# Patient Record
Sex: Female | Born: 1960 | Race: White | Hispanic: No | Smoking: Former smoker
Health system: Southern US, Community
[De-identification: ages and names within clinical notes are randomized; demographics above are authoritative.]

## PROBLEM LIST (undated history)

## (undated) DIAGNOSIS — Z9289 Personal history of other medical treatment: Secondary | ICD-10-CM

## (undated) DIAGNOSIS — D219 Benign neoplasm of connective and other soft tissue, unspecified: Secondary | ICD-10-CM

## (undated) HISTORY — DX: Benign neoplasm of connective and other soft tissue, unspecified: D21.9

## (undated) HISTORY — DX: Personal history of other medical treatment: Z92.89

---

## 2004-11-20 ENCOUNTER — Ambulatory Visit: Payer: Self-pay | Admitting: Unknown Physician Specialty

## 2005-12-11 ENCOUNTER — Ambulatory Visit: Payer: Self-pay | Admitting: Unknown Physician Specialty

## 2006-12-19 ENCOUNTER — Ambulatory Visit: Payer: Self-pay | Admitting: Unknown Physician Specialty

## 2007-12-23 ENCOUNTER — Ambulatory Visit: Payer: Self-pay | Admitting: Unknown Physician Specialty

## 2008-12-27 ENCOUNTER — Ambulatory Visit: Payer: Self-pay | Admitting: Unknown Physician Specialty

## 2010-02-15 ENCOUNTER — Ambulatory Visit: Payer: Self-pay | Admitting: Unknown Physician Specialty

## 2011-02-20 ENCOUNTER — Ambulatory Visit: Payer: Self-pay | Admitting: Unknown Physician Specialty

## 2014-01-05 DIAGNOSIS — Z9289 Personal history of other medical treatment: Secondary | ICD-10-CM

## 2014-01-05 HISTORY — DX: Personal history of other medical treatment: Z92.89

## 2014-01-05 LAB — HM PAP SMEAR

## 2016-01-11 DIAGNOSIS — Z1239 Encounter for other screening for malignant neoplasm of breast: Secondary | ICD-10-CM | POA: Diagnosis not present

## 2016-01-11 DIAGNOSIS — R030 Elevated blood-pressure reading, without diagnosis of hypertension: Secondary | ICD-10-CM | POA: Diagnosis not present

## 2016-01-11 DIAGNOSIS — Z1211 Encounter for screening for malignant neoplasm of colon: Secondary | ICD-10-CM | POA: Diagnosis not present

## 2016-01-11 DIAGNOSIS — Z01419 Encounter for gynecological examination (general) (routine) without abnormal findings: Secondary | ICD-10-CM | POA: Diagnosis not present

## 2016-01-12 ENCOUNTER — Other Ambulatory Visit: Payer: Self-pay | Admitting: Obstetrics and Gynecology

## 2016-01-12 DIAGNOSIS — Z1231 Encounter for screening mammogram for malignant neoplasm of breast: Secondary | ICD-10-CM

## 2016-02-13 DIAGNOSIS — Z1211 Encounter for screening for malignant neoplasm of colon: Secondary | ICD-10-CM | POA: Diagnosis not present

## 2016-03-11 ENCOUNTER — Ambulatory Visit
Admission: RE | Admit: 2016-03-11 | Discharge: 2016-03-11 | Disposition: A | Discharge status: Home / Self Care | Payer: BLUE CROSS/BLUE SHIELD | Source: Ambulatory Visit | Attending: Obstetrics and Gynecology | Admitting: Obstetrics and Gynecology

## 2016-03-11 ENCOUNTER — Other Ambulatory Visit: Payer: Self-pay | Admitting: Obstetrics and Gynecology

## 2016-03-11 DIAGNOSIS — Z1231 Encounter for screening mammogram for malignant neoplasm of breast: Secondary | ICD-10-CM

## 2016-03-11 LAB — HM MAMMOGRAPHY

## 2016-04-14 HISTORY — PX: COLONOSCOPY: SHX174

## 2016-05-03 ENCOUNTER — Encounter
Admission: RE | Disposition: A | Discharge status: Home / Self Care | Payer: Self-pay | Source: Ambulatory Visit | Attending: Gastroenterology

## 2016-05-03 ENCOUNTER — Ambulatory Visit
Admission: RE | Admit: 2016-05-03 | Discharge: 2016-05-03 | Disposition: A | Discharge status: Home / Self Care | Payer: BLUE CROSS/BLUE SHIELD | Source: Ambulatory Visit | Attending: Gastroenterology | Admitting: Gastroenterology

## 2016-05-03 ENCOUNTER — Ambulatory Visit: Payer: BLUE CROSS/BLUE SHIELD | Admitting: Anesthesiology

## 2016-05-03 ENCOUNTER — Encounter: Payer: Self-pay | Admitting: *Deleted

## 2016-05-03 DIAGNOSIS — Z79899 Other long term (current) drug therapy: Secondary | ICD-10-CM | POA: Insufficient documentation

## 2016-05-03 DIAGNOSIS — Z87891 Personal history of nicotine dependence: Secondary | ICD-10-CM | POA: Insufficient documentation

## 2016-05-03 DIAGNOSIS — K635 Polyp of colon: Secondary | ICD-10-CM | POA: Diagnosis not present

## 2016-05-03 DIAGNOSIS — Q438 Other specified congenital malformations of intestine: Secondary | ICD-10-CM | POA: Insufficient documentation

## 2016-05-03 DIAGNOSIS — Z1211 Encounter for screening for malignant neoplasm of colon: Secondary | ICD-10-CM | POA: Diagnosis not present

## 2016-05-03 DIAGNOSIS — D120 Benign neoplasm of cecum: Secondary | ICD-10-CM | POA: Diagnosis not present

## 2016-05-03 HISTORY — PX: COLONOSCOPY: SHX5424

## 2016-05-03 SURGERY — COLONOSCOPY
Anesthesia: General

## 2016-05-03 MED ORDER — EPHEDRINE SULFATE 50 MG/ML IJ SOLN
INTRAMUSCULAR | Status: DC | PRN
  Administered 2016-05-03: 10 mg via INTRAVENOUS

## 2016-05-03 MED ORDER — MIDAZOLAM HCL 2 MG/2ML IJ SOLN
INTRAMUSCULAR | Status: DC | PRN
  Administered 2016-05-03: 2 mg via INTRAVENOUS

## 2016-05-03 MED ORDER — ONDANSETRON HCL 4 MG/2ML IJ SOLN: 4.0000 mg | Freq: Once | INTRAMUSCULAR | Status: DC | PRN

## 2016-05-03 MED ORDER — PROPOFOL 10 MG/ML IV BOLUS
INTRAVENOUS | Status: DC | PRN
Start: 2016-05-03 — End: 2016-05-03
  Administered 2016-05-03: 30 mg via INTRAVENOUS
  Administered 2016-05-03 (×2): 20 mg via INTRAVENOUS
  Administered 2016-05-03: 50 mg via INTRAVENOUS

## 2016-05-03 MED ORDER — SODIUM CHLORIDE 0.9 % IV SOLN: INTRAVENOUS | Status: DC

## 2016-05-03 MED ORDER — PROPOFOL 500 MG/50ML IV EMUL
INTRAVENOUS | Status: DC | PRN
  Administered 2016-05-03: 140 ug/kg/min via INTRAVENOUS

## 2016-05-03 MED ORDER — SODIUM CHLORIDE 0.9 % IV SOLN
INTRAVENOUS | Status: DC
  Administered 2016-05-03 (×2): via INTRAVENOUS

## 2016-05-03 MED ORDER — LIDOCAINE HCL (CARDIAC) 20 MG/ML IV SOLN
INTRAVENOUS | Status: DC | PRN
  Administered 2016-05-03: 40 mg via INTRAVENOUS

## 2016-05-03 MED ORDER — FENTANYL CITRATE (PF) 100 MCG/2ML IJ SOLN
25.0000 ug | INTRAMUSCULAR | Status: DC | PRN
  Administered 2016-05-03: 50 ug via INTRAVENOUS

## 2016-05-03 NOTE — Anesthesia Preprocedure Evaluation (Addendum)
Anesthesia Evaluation  Patient identified by MRN, date of birth, ID band Patient awake    Reviewed: Allergy & Precautions, NPO status , Patient's Chart, lab work & pertinent test results  Airway Mallampati: II  TM Distance: <3 FB     Dental no notable dental hx.    Pulmonary former smoker,    Pulmonary exam normal        Cardiovascular negative cardio ROS Normal cardiovascular exam     Neuro/Psych negative neurological ROS  negative psych ROS   GI/Hepatic Neg liver ROS,   Endo/Other  negative endocrine ROS  Renal/GU negative Renal ROS  negative genitourinary   Musculoskeletal negative musculoskeletal ROS (+)   Abdominal Normal abdominal exam  (+)   Peds negative pediatric ROS (+)  Hematology negative hematology ROS (+)   Anesthesia Other Findings   Reproductive/Obstetrics                            Anesthesia Physical Anesthesia Plan  ASA: II  Anesthesia Plan: General   Post-op Pain Management:    Induction: Intravenous  Airway Management Planned: Nasal Cannula  Additional Equipment:   Intra-op Plan:   Post-operative Plan:   Informed Consent: I have reviewed the patients History and Physical, chart, labs and discussed the procedure including the risks, benefits and alternatives for the proposed anesthesia with the patient or authorized representative who has indicated his/her understanding and acceptance.   Dental advisory given  Plan Discussed with: CRNA and Surgeon  Anesthesia Plan Comments:         Anesthesia Quick Evaluation

## 2016-05-03 NOTE — Transfer of Care (Signed)
Immediate Anesthesia Transfer of Care Note  Patient: Renee Bruce  Procedure(s) Performed: Procedure(s): COLONOSCOPY (N/A)  Patient Location: PACU  Anesthesia Type:General  Level of Consciousness: awake  Airway & Oxygen Therapy: Patient Spontanous Breathing and Patient connected to nasal cannula oxygen  Post-op Assessment: Report given to RN and Post -op Vital signs reviewed and stable  Post vital signs: Reviewed and stable  Last Vitals:  Vitals:   05/03/16 1415  BP: (!) 142/76  Pulse: 76  Resp: 20  Temp: 36.8 C    Last Pain:  Vitals:   05/03/16 1415  TempSrc: Tympanic         Complications: No apparent anesthesia complications

## 2016-05-03 NOTE — H&P (Signed)
Outpatient short stay form Pre-procedure 05/03/2016 2:46 PM Lollie Sails MD  Primary Physician: Fraser Din PA  Reason for visit:  Screening colonoscopy  History of present illness:  Patient is a 55 year old female presenting today as above. She takes no blood thinning agents or aspirin products. She tolerated her prep well. This is her first colonoscopy.    Current Facility-Administered Medications:  .  0.9 %  sodium chloride infusion, , Intravenous, Continuous, Lollie Sails, MD, Last Rate: 20 mL/hr at 05/03/16 1427 .  0.9 %  sodium chloride infusion, , Intravenous, Continuous, Lollie Sails, MD .  fentaNYL (SUBLIMAZE) injection 25 mcg, 25 mcg, Intravenous, Q5 min PRN, Alvin Critchley, MD .  ondansetron [facility]) injection 4 mg, 4 mg, Intravenous, Once PRN, Alvin Critchley, MD  Prescriptions Prior to Admission  Medication Sig Dispense Refill Last Dose  . calcium carbonate (OS-CAL - DOSED IN MG OF ELEMENTAL CALCIUM) 1250 (500 Ca) MG tablet Take 1 tablet by mouth.   Past Week at Unknown time  . magnesium 30 MG tablet Take 30 mg by mouth 2 (two) times daily.   Past Week at Unknown time  . Multiple Vitamin (MULTIVITAMIN) tablet Take 1 tablet by mouth daily.   Past Week at Unknown time  . Omega-3 Fatty Acids (FISH OIL) 1000 MG CAPS Take by mouth.   Past Week at Unknown time     No Known Allergies   History reviewed. No pertinent past medical history.  Review of systems:      Physical Exam    Heart and lungs: Regular rate and rhythm without rub or gallop, lungs are bilaterally clear.    HEENT: Normocephalic atraumatic eyes are anicteric    Other:     Pertinant exam for procedure: Soft nontender nondistended bowel sounds positive normoactive    Planned proceedures: Colonoscopy and indicated procedures. I have discussed the risks benefits and complications of procedures to include not limited to bleeding, infection, perforation and the risk of sedation and the  patient wishes to proceed.    Lollie Sails, MD Gastroenterology 05/03/2016  2:46 PM

## 2016-05-03 NOTE — Op Note (Signed)
[facility] Gastroenterology Patient Name: Renee Bruce Procedure Date: 05/03/2016 2:56 PM MRN: [id number] Account #: [id number] Date of Birth: 09-21-1960 Admit Type: Outpatient Age: 56 Room: [facility] ENDO ROOM 3 Gender: Female Note Status: Finalized Procedure:            Colonoscopy Indications:          Screening for colorectal malignant neoplasm, This is                        the patient's first colonoscopy Providers:            Lollie Sails, MD Referring MD:         No Local Md, MD (Referring MD) Medicines:            Monitored Anesthesia Care Complications:        No immediate complications. Procedure:            Pre-Anesthesia Assessment:                       - ASA Grade Assessment: II - A patient with mild                        systemic disease.                       After obtaining informed consent, the colonoscope was                        passed under direct vision. Throughout the procedure,                        the patient's blood pressure, pulse, and oxygen                        saturations were monitored continuously. The                        Colonoscope was introduced through the anus and                        advanced to the the cecum, identified by appendiceal                        orifice and ileocecal valve. The colonoscopy was                        unusually difficult due to a tortuous colon. Successful                        completion of the procedure was aided by changing the                        patient to a supine position, changing the patient to a                        prone position and using manual pressure. The patient                        tolerated the procedure well. The quality of the bowel  preparation was good. Findings:      A 5 mm polyp was found in the cecum. The polyp was sessile, behind the       proximal lip of the IC valve. Difficult to obtain position. The polyp       was  removed with a piecemeal technique using a cold biopsy forceps.       Resection and retrieval were complete.      The sigmoid colon, descending colon and transverse colon were       significantly redundant.      The digital rectal exam was normal. Impression:           - One 5 mm polyp in the cecum, removed piecemeal using                        a cold biopsy forceps. Resected and retrieved.                       - Redundant colon. Recommendation:       - Discharge patient to home.                       - Full liquid diet for 2 days, then advance as                        tolerated to soft diet for 3 days.                       - Await pathology results.                       - If adenomatous, repeat in one year. Procedure Code(s):    --- Professional ---                       [phone], Colonoscopy, flexible; with biopsy, single or                        multiple Diagnosis Code(s):    --- Professional ---                       Z12.11, Encounter for screening for malignant neoplasm                        of colon                       D12.0, Benign neoplasm of cecum                       Q43.8, Other specified congenital malformations of                        intestine CPT copyright 2016 American Medical Association. All rights reserved. The codes documented in this report are preliminary and upon coder review may  be revised to meet current compliance requirements. Lollie Sails, MD 05/03/2016 4:07:13 PM This report has been signed electronically. Number of Addenda: 0 Note Initiated On: 05/03/2016 2:56 PM Scope Withdrawal Time: 0 hours 31 minutes 33 seconds  Total Procedure Duration: 0 hours 49 minutes 1 second       [facility]

## 2016-05-04 ENCOUNTER — Encounter: Payer: Self-pay | Admitting: Gastroenterology

## 2016-05-04 NOTE — Anesthesia Postprocedure Evaluation (Signed)
Anesthesia Post Note  Patient: Renee Bruce  Procedure(s) Performed: Procedure(s) (LRB): COLONOSCOPY (N/A)  Patient location during evaluation: Endoscopy Anesthesia Type: General Level of consciousness: awake and alert Pain management: pain level controlled Vital Signs Assessment: post-procedure vital signs reviewed and stable Respiratory status: spontaneous breathing, nonlabored ventilation and respiratory function stable Cardiovascular status: blood pressure returned to baseline and stable Postop Assessment: no signs of nausea or vomiting Anesthetic complications: no    Last Vitals:  Vitals:   05/03/16 1620 05/03/16 1638  BP: 119/62 119/73  Pulse: 72 69  Resp: 12 14  Temp:      Last Pain:  Vitals:   05/03/16 1608  TempSrc: Tympanic                 Martha Clan

## 2016-05-07 LAB — SURGICAL PATHOLOGY

## 2016-08-12 DIAGNOSIS — L309 Dermatitis, unspecified: Secondary | ICD-10-CM | POA: Diagnosis not present

## 2016-08-12 DIAGNOSIS — L97921 Non-pressure chronic ulcer of unspecified part of left lower leg limited to breakdown of skin: Secondary | ICD-10-CM | POA: Diagnosis not present

## 2016-08-28 DIAGNOSIS — I872 Venous insufficiency (chronic) (peripheral): Secondary | ICD-10-CM | POA: Diagnosis not present

## 2016-08-28 DIAGNOSIS — L97921 Non-pressure chronic ulcer of unspecified part of left lower leg limited to breakdown of skin: Secondary | ICD-10-CM | POA: Diagnosis not present

## 2016-09-11 DIAGNOSIS — L97921 Non-pressure chronic ulcer of unspecified part of left lower leg limited to breakdown of skin: Secondary | ICD-10-CM | POA: Diagnosis not present

## 2016-09-24 DIAGNOSIS — L97921 Non-pressure chronic ulcer of unspecified part of left lower leg limited to breakdown of skin: Secondary | ICD-10-CM | POA: Diagnosis not present

## 2017-01-20 ENCOUNTER — Ambulatory Visit (INDEPENDENT_AMBULATORY_CARE_PROVIDER_SITE_OTHER): Payer: BLUE CROSS/BLUE SHIELD | Admitting: Obstetrics and Gynecology

## 2017-01-20 ENCOUNTER — Encounter: Payer: Self-pay | Admitting: Obstetrics and Gynecology

## 2017-01-20 VITALS — BP 116/62 | HR 72 | Ht 67.0 in | Wt 185.0 lb

## 2017-01-20 DIAGNOSIS — Z124 Encounter for screening for malignant neoplasm of cervix: Secondary | ICD-10-CM | POA: Diagnosis not present

## 2017-01-20 DIAGNOSIS — Z1231 Encounter for screening mammogram for malignant neoplasm of breast: Secondary | ICD-10-CM

## 2017-01-20 DIAGNOSIS — Z01419 Encounter for gynecological examination (general) (routine) without abnormal findings: Secondary | ICD-10-CM

## 2017-01-20 DIAGNOSIS — Z1239 Encounter for other screening for malignant neoplasm of breast: Secondary | ICD-10-CM

## 2017-01-20 DIAGNOSIS — Z1151 Encounter for screening for human papillomavirus (HPV): Secondary | ICD-10-CM | POA: Diagnosis not present

## 2017-01-20 NOTE — Progress Notes (Signed)
Chief Complaint  Patient presents with  . Gynecologic Exam    HPI:      [name] is a 56 y.o. G1P1001 who LMP was No LMP recorded. Patient is postmenopausal., presents today for her annual examination.  Her menses are absent due to menopause. She does not have intermenstrual bleeding.  She does have vasomotor sx infrequently.  Sex activity: not sexually active. She does not have vaginal dryness.  Last Pap: January 05, 2014  Results were: no abnormalities /neg HPV DNA.  Hx of STDs: none  Last mammogram: March 11, 2016  Results were: normal--routine follow-up in 12 months There is a FH of breast cancer in her PGM, genetic testing not indicated. There is no FH of ovarian cancer. The patient does not do self-breast exams.  Colonoscopy: colonoscopy 1 years ago without abnormalities.   Tobacco use: The patient denies current or previous tobacco use. Alcohol use: none Exercise: not active  She does get adequate calcium and Vitamin D in her diet.   Past Medical History:  Diagnosis Date  . Fibroid   . History of mammogram 03/11/2015; 03/11/16   birad I; neg  . History of Papanicolaou smear of cervix 01/05/2014   -/-    Past Surgical History:  Procedure Laterality Date  . COLONOSCOPY N/A 05/03/2016   Procedure: COLONOSCOPY;  Surgeon: Lollie Sails, MD;  Location: [facility] ENDOSCOPY;  Service: Endoscopy;  Laterality: N/A;  . COLONOSCOPY  04/2016   non-cancerous polyp; Dr. Vira Agar; repeat in 5-27yrs    Family History  Problem Relation Age of Onset  . Breast cancer Paternal Grandmother 25  . Hypertension Brother   . Cancer Maternal Grandfather 64    Social History   Social History  . Marital status: Divorced    Spouse name: N/A  . Number of children: N/A  . Years of education: N/A   Occupational History  . Not on file.   Social History Main Topics  . Smoking status: Former Research scientist (life sciences)  . Smokeless tobacco: Never Used  . Alcohol use Yes     Comment: rare    . Drug use: No  . Sexual activity: Not Currently    Birth control/ protection: None   Other Topics Concern  . Not on file   Social History Narrative  . No narrative on file     Current Outpatient Prescriptions:  .  calcium carbonate (OS-CAL - DOSED IN MG OF ELEMENTAL CALCIUM) 1250 (500 Ca) MG tablet, Take 1 tablet by mouth., Disp: , Rfl:  .  magnesium 30 MG tablet, Take 30 mg by mouth 2 (two) times daily., Disp: , Rfl:  .  Multiple Vitamin (MULTIVITAMIN) tablet, Take 1 tablet by mouth daily., Disp: , Rfl:  .  Omega-3 Fatty Acids (FISH OIL) 1000 MG CAPS, Take by mouth., Disp: , Rfl:  .  vitamin B-12 (CYANOCOBALAMIN) 100 MCG tablet, Take 100 mcg by mouth daily., Disp: , Rfl:    ROS:  Review of Systems  Constitutional: Negative for fatigue, fever and unexpected weight change.  Respiratory: Negative for cough, shortness of breath and wheezing.   Cardiovascular: Negative for chest pain, palpitations and leg swelling.  Gastrointestinal: Negative for blood in stool, constipation, diarrhea, nausea and vomiting.  Endocrine: Negative for cold intolerance, heat intolerance and polyuria.  Genitourinary: Negative for dyspareunia, dysuria, flank pain, frequency, genital sores, hematuria, menstrual problem, pelvic pain, urgency, vaginal bleeding, vaginal discharge and vaginal pain.  Musculoskeletal: Negative for back pain, joint swelling and myalgias.  Skin: Negative for rash.  Neurological: Negative for dizziness, syncope, light-headedness, numbness and headaches.  Hematological: Negative for adenopathy.  Psychiatric/Behavioral: Negative for agitation, confusion, sleep disturbance and suicidal ideas. The patient is not nervous/anxious.      Objective: BP 116/62 (BP Location: Left Arm, Patient Position: Sitting, Cuff Size: Normal)   Pulse 72   Ht 5\' 7"  (1.702 m)   Wt 185 lb (83.9 kg)   BMI 28.98 kg/m    Physical Exam  Constitutional: She is oriented to person, place, and time. She  appears well-developed and well-nourished.  Genitourinary: Vagina normal and uterus normal. There is no rash, tenderness, lesion or Bartholin's cyst on the right labia. There is no rash, tenderness, lesion or Bartholin's cyst on the left labia. No erythema, tenderness or bleeding in the vagina. No vaginal discharge found. Right adnexum does not display mass and does not display tenderness. Left adnexum does not display mass and does not display tenderness. Cervix does not exhibit motion tenderness, discharge, friability or polyp. Uterus is not enlarged or tender.  Neck: Normal range of motion. No thyromegaly present.  Cardiovascular: Normal rate, regular rhythm and normal heart sounds.   No murmur heard. Pulmonary/Chest: Effort normal and breath sounds normal. Right breast exhibits no mass, no nipple discharge, no skin change and no tenderness. Left breast exhibits no mass, no nipple discharge, no skin change and no tenderness.  Abdominal: Soft. There is no tenderness. There is no guarding.  Musculoskeletal: Normal range of motion.  Neurological: She is alert and oriented to person, place, and time. No cranial nerve deficit.  Psychiatric: She has a normal mood and affect. Her behavior is normal.  Nursing note and vitals reviewed.    Assessment/Plan:  Encounter for annual routine gynecological examination  Cervical cancer screening - Plan: IGP, Aptima HPV  Screening for HPV (human papillomavirus) - Plan: IGP, Aptima HPV  Screening for breast cancer - Pt to sched mammo at [facility]. - Plan: MM DIGITAL SCREENING BILATERAL          GYN counsel mammography screening, menopause, adequate intake of calcium and vitamin D     F/U  Return in about 1 year (around 01/20/2018).  Mohamad Bruso B. Rukia Mcgillivray, PA-C 01/20/2017 9:07 AM

## 2017-01-22 LAB — IGP, APTIMA HPV
HPV APTIMA: NEGATIVE
PAP SMEAR COMMENT: 0

## 2017-03-13 ENCOUNTER — Ambulatory Visit
Admission: RE | Admit: 2017-03-13 | Discharge: 2017-03-13 | Disposition: A | Discharge status: Home / Self Care | Payer: BLUE CROSS/BLUE SHIELD | Source: Ambulatory Visit | Attending: Obstetrics and Gynecology | Admitting: Obstetrics and Gynecology

## 2017-03-13 DIAGNOSIS — Z1231 Encounter for screening mammogram for malignant neoplasm of breast: Secondary | ICD-10-CM | POA: Diagnosis not present

## 2017-03-13 DIAGNOSIS — Z1239 Encounter for other screening for malignant neoplasm of breast: Secondary | ICD-10-CM

## 2017-03-14 ENCOUNTER — Encounter: Payer: Self-pay | Admitting: Obstetrics and Gynecology

## 2018-01-21 ENCOUNTER — Ambulatory Visit (INDEPENDENT_AMBULATORY_CARE_PROVIDER_SITE_OTHER): Payer: BLUE CROSS/BLUE SHIELD | Admitting: Obstetrics and Gynecology

## 2018-01-21 ENCOUNTER — Encounter: Payer: Self-pay | Admitting: Obstetrics and Gynecology

## 2018-01-21 VITALS — BP 118/72 | HR 89 | Ht 67.0 in | Wt 186.5 lb

## 2018-01-21 DIAGNOSIS — Z01419 Encounter for gynecological examination (general) (routine) without abnormal findings: Secondary | ICD-10-CM

## 2018-01-21 DIAGNOSIS — Z13220 Encounter for screening for lipoid disorders: Secondary | ICD-10-CM | POA: Diagnosis not present

## 2018-01-21 DIAGNOSIS — Z131 Encounter for screening for diabetes mellitus: Secondary | ICD-10-CM | POA: Diagnosis not present

## 2018-01-21 DIAGNOSIS — Z0000 Encounter for general adult medical examination without abnormal findings: Secondary | ICD-10-CM | POA: Diagnosis not present

## 2018-01-21 DIAGNOSIS — Z1239 Encounter for other screening for malignant neoplasm of breast: Secondary | ICD-10-CM

## 2018-01-21 DIAGNOSIS — Z1231 Encounter for screening mammogram for malignant neoplasm of breast: Secondary | ICD-10-CM | POA: Diagnosis not present

## 2018-01-21 NOTE — Progress Notes (Signed)
Chief Complaint  Patient presents with  . Gynecologic Exam     HPI:      [name] is a 57 y.o. G1P1001 who LMP was No LMP recorded. Patient is postmenopausal., presents today for her annual examination.  Her menses are absent due to menopause. She does not have intermenstrual bleeding.  She does have vasomotor sx occasionally that are tolerable.  Sex activity: not sexually active. She does not have vaginal dryness.  Last Pap: 01/20/17 Results were: no abnormalities /neg HPV DNA.  Hx of STDs: none  Last mammogram: March 13, 2017  Results were: normal--routine follow-up in 12 months There is a FH of breast cancer in her PGM, genetic testing not indicated. There is no FH of ovarian cancer. The patient does not do self-breast exams.  Colonoscopy: colonoscopy 2 years ago without abnormalities.   Tobacco use: The patient denies current or previous tobacco use. Alcohol use: none Exercise: not active  She does get adequate calcium but not Vitamin D in her diet.  No recent labs.   Past Medical History:  Diagnosis Date  . Fibroid   . History of mammogram 03/11/2015; 03/11/16   birad I; neg  . History of Papanicolaou smear of cervix 01/05/2014   -/-    Past Surgical History:  Procedure Laterality Date  . COLONOSCOPY N/A 05/03/2016   Procedure: COLONOSCOPY;  Surgeon: Lollie Sails, MD;  Location: [facility] ENDOSCOPY;  Service: Endoscopy;  Laterality: N/A;  . COLONOSCOPY  04/2016   non-cancerous polyp; Dr. Vira Agar; repeat in 5-25yrs    Family History  Problem Relation Age of Onset  . Breast cancer Paternal Grandmother 107  . Hypertension Brother   . Cancer Maternal Grandfather 41    Social History   Socioeconomic History  . Marital status: Divorced    Spouse name: Not on file  . Number of children: Not on file  . Years of education: Not on file  . Highest education level: Not on file  Occupational History  . Not on file  Social Needs  .  Financial resource strain: Not on file  . Food insecurity:    Worry: Not on file    Inability: Not on file  . Transportation needs:    Medical: Not on file    Non-medical: Not on file  Tobacco Use  . Smoking status: Former Research scientist (life sciences)  . Smokeless tobacco: Never Used  Substance and Sexual Activity  . Alcohol use: Yes    Comment: rare  . Drug use: No  . Sexual activity: Not Currently    Birth control/protection: None, Post-menopausal  Lifestyle  . Physical activity:    Days per week: Not on file    Minutes per session: Not on file  . Stress: Not on file  Relationships  . Social connections:    Talks on phone: Not on file    Gets together: Not on file    Attends religious service: Not on file    Active member of club or organization: Not on file    Attends meetings of clubs or organizations: Not on file    Relationship status: Not on file  . Intimate partner violence:    Fear of current or ex partner: Not on file    Emotionally abused: Not on file    Physically abused: Not on file    Forced sexual activity: Not on file  Other Topics Concern  . Not on file  Social History Narrative  . Not on  file    Current Outpatient Medications on File Prior to Visit  Medication Sig Dispense Refill  . magnesium 30 MG tablet Take 30 mg by mouth 2 (two) times daily.     No current facility-administered medications on file prior to visit.     ROS:  Review of Systems  Constitutional: Negative for fatigue, fever and unexpected weight change.  Respiratory: Negative for cough, shortness of breath and wheezing.   Cardiovascular: Negative for chest pain, palpitations and leg swelling.  Gastrointestinal: Negative for blood in stool, constipation, diarrhea, nausea and vomiting.  Endocrine: Negative for cold intolerance, heat intolerance and polyuria.  Genitourinary: Negative for dyspareunia, dysuria, flank pain, frequency, genital sores, hematuria, menstrual problem, pelvic pain, urgency,  vaginal bleeding, vaginal discharge and vaginal pain.  Musculoskeletal: Negative for back pain, joint swelling and myalgias.  Skin: Negative for rash.  Neurological: Negative for dizziness, syncope, light-headedness, numbness and headaches.  Hematological: Negative for adenopathy.  Psychiatric/Behavioral: Negative for agitation, confusion, sleep disturbance and suicidal ideas. The patient is not nervous/anxious.      Objective: BP 118/72   Pulse 89   Ht 5\' 7"  (1.702 m)   Wt 186 lb 8 oz (84.6 kg)   BMI 29.21 kg/m    Physical Exam  Constitutional: She is oriented to person, place, and time. She appears well-developed and well-nourished.  Genitourinary: Vagina normal and uterus normal. There is no rash or tenderness on the right labia. There is no rash or tenderness on the left labia. No erythema or tenderness in the vagina. No vaginal discharge found. Right adnexum does not display mass and does not display tenderness. Left adnexum does not display mass and does not display tenderness. Cervix does not exhibit motion tenderness or polyp. Uterus is not enlarged or tender.  Neck: Normal range of motion. No thyromegaly present.  Cardiovascular: Normal rate, regular rhythm and normal heart sounds.  No murmur heard. Pulmonary/Chest: Effort normal and breath sounds normal. Right breast exhibits no mass, no nipple discharge, no skin change and no tenderness. Left breast exhibits no mass, no nipple discharge, no skin change and no tenderness.  Abdominal: Soft. There is no tenderness. There is no guarding.  Musculoskeletal: Normal range of motion.  Neurological: She is alert and oriented to person, place, and time. No cranial nerve deficit.  Psychiatric: She has a normal mood and affect. Her behavior is normal.  Vitals reviewed.   Assessment/Plan: Encounter for annual routine gynecological examination  Screening for breast cancer - Pt to sched mammo. - Plan: MM 3D SCREEN BREAST  BILATERAL  Blood tests for routine general physical examination - Plan: Comprehensive metabolic panel, Lipid panel, Hemoglobin A1c  Screening cholesterol level - Plan: Lipid panel  Screening for diabetes mellitus - Plan: Hemoglobin A1c          GYN counsel mammography screening, menopause, adequate intake of calcium and vitamin D, diet and exercise     F/U  Return in about 1 year (around 01/22/2019).  Amrom Ore B. Tyshauna Finkbiner, PA-C 01/21/2018 8:35 AM

## 2018-01-21 NOTE — Patient Instructions (Signed)
I value your feedback and entrusting us with your care. If you get a [facility] patient survey, I would appreciate you taking the time to let us know about your experience today. Thank you! 

## 2018-03-18 ENCOUNTER — Encounter: Payer: Self-pay | Admitting: Obstetrics and Gynecology

## 2018-03-18 ENCOUNTER — Ambulatory Visit
Admission: RE | Admit: 2018-03-18 | Discharge: 2018-03-18 | Disposition: A | Discharge status: Home / Self Care | Payer: BLUE CROSS/BLUE SHIELD | Source: Ambulatory Visit | Attending: Obstetrics and Gynecology | Admitting: Obstetrics and Gynecology

## 2018-03-18 DIAGNOSIS — Z1239 Encounter for other screening for malignant neoplasm of breast: Secondary | ICD-10-CM

## 2018-03-18 DIAGNOSIS — Z1231 Encounter for screening mammogram for malignant neoplasm of breast: Secondary | ICD-10-CM | POA: Diagnosis not present

## 2019-02-17 ENCOUNTER — Ambulatory Visit: Payer: BLUE CROSS/BLUE SHIELD | Admitting: Obstetrics and Gynecology

## 2019-02-24 NOTE — Patient Instructions (Signed)
I value your feedback and entrusting us with your care. If you get a [facility] patient survey, I would appreciate you taking the time to let us know about your experience today. Thank you! 

## 2019-02-24 NOTE — Progress Notes (Signed)
Chief Complaint  Patient presents with  . Gynecologic Exam     HPI:      [name] is a 58 y.o. G1P1001 who LMP was No LMP recorded. Patient is postmenopausal., presents today for her annual examination.  Her menses are absent due to menopause. She does not have intermenstrual bleeding. She does have vasomotor sx occasionally that are tolerable.  Sex activity: not sexually active. She does not have vaginal dryness.  Last Pap: 01/20/17 Results were: no abnormalities /neg HPV DNA.  Hx of STDs: none  Last mammogram: 03/18/18  Results were: normal--routine follow-up in 12 months There is a FH of breast cancer in her PGM, genetic testing not indicated. There is no FH of ovarian cancer. The patient does not do self-breast exams.  Colonoscopy: colonoscopy 3 years ago without abnormalities.   Tobacco use: The patient denies current or previous tobacco use. Alcohol use: none Exercise: not active  She does get adequate calcium but not Vitamin D in her diet.  Didn't do fasting labs last yr.   Past Medical History:  Diagnosis Date  . Fibroid   . History of mammogram 03/11/2015; 03/11/16   birad I; neg  . History of Papanicolaou smear of cervix 01/05/2014   -/-    Past Surgical History:  Procedure Laterality Date  . COLONOSCOPY N/A 05/03/2016   Procedure: COLONOSCOPY;  Surgeon: Lollie Sails, MD;  Location: [facility] ENDOSCOPY;  Service: Endoscopy;  Laterality: N/A;  . COLONOSCOPY  04/2016   non-cancerous polyp; Dr. Vira Agar; repeat in 5-37yrs    Family History  Problem Relation Age of Onset  . Breast cancer Paternal Grandmother 23  . Hypertension Brother   . Cancer Maternal Grandfather 80    Social History   Socioeconomic History  . Marital status: Divorced    Spouse name: Not on file  . Number of children: Not on file  . Years of education: Not on file  . Highest education level: Not on file  Occupational History  . Not on file  Social Needs  .  Financial resource strain: Not on file  . Food insecurity    Worry: Not on file    Inability: Not on file  . Transportation needs    Medical: Not on file    Non-medical: Not on file  Tobacco Use  . Smoking status: Former Research scientist (life sciences)  . Smokeless tobacco: Never Used  Substance and Sexual Activity  . Alcohol use: Yes    Comment: rare  . Drug use: No  . Sexual activity: Not Currently    Birth control/protection: None, Post-menopausal  Lifestyle  . Physical activity    Days per week: Not on file    Minutes per session: Not on file  . Stress: Not on file  Relationships  . Social Herbalist on phone: Not on file    Gets together: Not on file    Attends religious service: Not on file    Active member of club or organization: Not on file    Attends meetings of clubs or organizations: Not on file    Relationship status: Not on file  . Intimate partner violence    Fear of current or ex partner: Not on file    Emotionally abused: Not on file    Physically abused: Not on file    Forced sexual activity: Not on file  Other Topics Concern  . Not on file  Social History Narrative  . Not on  file    No current outpatient medications on file prior to visit.   No current facility-administered medications on file prior to visit.     ROS:  Review of Systems  Constitutional: Negative for fatigue, fever and unexpected weight change.  Respiratory: Negative for cough, shortness of breath and wheezing.   Cardiovascular: Negative for chest pain, palpitations and leg swelling.  Gastrointestinal: Negative for blood in stool, constipation, diarrhea, nausea and vomiting.  Endocrine: Negative for cold intolerance, heat intolerance and polyuria.  Genitourinary: Negative for dyspareunia, dysuria, flank pain, frequency, genital sores, hematuria, menstrual problem, pelvic pain, urgency, vaginal bleeding, vaginal discharge and vaginal pain.  Musculoskeletal: Positive for arthralgias. Negative  for back pain, joint swelling and myalgias.  Skin: Negative for rash.  Neurological: Negative for dizziness, syncope, light-headedness, numbness and headaches.  Hematological: Negative for adenopathy.  Psychiatric/Behavioral: Negative for agitation, confusion, sleep disturbance and suicidal ideas. The patient is not nervous/anxious.      Objective: BP 122/80   Ht 5\' 7"  (1.702 m)   Wt 199 lb 6.4 oz (90.4 kg)   BMI 31.23 kg/m    Physical Exam Constitutional:      Appearance: She is well-developed.  Genitourinary:     Vulva, vagina, uterus, right adnexa and left adnexa normal.     No vulval lesion or tenderness noted.     No vaginal discharge, erythema or tenderness.     No cervical motion tenderness or polyp.     Uterus is not enlarged or tender.     No right or left adnexal mass present.     Right adnexa not tender.     Left adnexa not tender.  Neck:     Musculoskeletal: Normal range of motion.     Thyroid: No thyromegaly.  Cardiovascular:     Rate and Rhythm: Normal rate and regular rhythm.     Heart sounds: Normal heart sounds. No murmur.  Pulmonary:     Effort: Pulmonary effort is normal.     Breath sounds: Normal breath sounds.  Chest:     Breasts:        Right: No mass, nipple discharge, skin change or tenderness.        Left: No mass, nipple discharge, skin change or tenderness.  Abdominal:     Palpations: Abdomen is soft.     Tenderness: There is no abdominal tenderness. There is no guarding.  Musculoskeletal: Normal range of motion.  Neurological:     General: No focal deficit present.     Mental Status: She is alert and oriented to person, place, and time.     Cranial Nerves: No cranial nerve deficit.  Skin:    General: Skin is warm and dry.  Psychiatric:        Mood and Affect: Mood normal.        Behavior: Behavior normal.        Thought Content: Thought content normal.        Judgment: Judgment normal.  Vitals signs reviewed.     Assessment/Plan:  Encounter for annual routine gynecological examination -   Screening for breast cancer - Plan: MM 3D SCREEN BREAST BILATERAL, Pt to sched mammo  Blood tests for routine general physical examination - Plan: Comprehensive metabolic panel, Lipid panel, Hemoglobin A1c,   Screening cholesterol level - Plan: Lipid panel,   Screening for diabetes mellitus - Plan: Hemoglobin A1c,   BMI 31.0-31.9,adult - Plan: Comprehensive metabolic panel, Lipid panel, Hemoglobin A1c,  GYN counsel mammography screening, menopause, adequate intake of calcium and vitamin D, diet and exercise     F/U  Return in 1 year (on 02/25/2020).  Ej Pinson B. Jaleen Grupp, PA-C 02/25/2019 8:33 AM

## 2019-02-25 ENCOUNTER — Ambulatory Visit (INDEPENDENT_AMBULATORY_CARE_PROVIDER_SITE_OTHER): Payer: BC Managed Care – PPO | Admitting: Obstetrics and Gynecology

## 2019-02-25 ENCOUNTER — Encounter: Payer: Self-pay | Admitting: Obstetrics and Gynecology

## 2019-02-25 ENCOUNTER — Other Ambulatory Visit: Payer: Self-pay

## 2019-02-25 VITALS — BP 122/80 | Ht 67.0 in | Wt 199.4 lb

## 2019-02-25 DIAGNOSIS — Z01419 Encounter for gynecological examination (general) (routine) without abnormal findings: Secondary | ICD-10-CM

## 2019-02-25 DIAGNOSIS — Z131 Encounter for screening for diabetes mellitus: Secondary | ICD-10-CM

## 2019-02-25 DIAGNOSIS — Z1239 Encounter for other screening for malignant neoplasm of breast: Secondary | ICD-10-CM

## 2019-02-25 DIAGNOSIS — Z6831 Body mass index (BMI) 31.0-31.9, adult: Secondary | ICD-10-CM

## 2019-02-25 DIAGNOSIS — Z0000 Encounter for general adult medical examination without abnormal findings: Secondary | ICD-10-CM

## 2019-02-25 DIAGNOSIS — Z13220 Encounter for screening for lipoid disorders: Secondary | ICD-10-CM

## 2019-04-05 ENCOUNTER — Encounter: Payer: Self-pay | Admitting: Obstetrics and Gynecology

## 2019-04-05 ENCOUNTER — Ambulatory Visit
Admission: RE | Admit: 2019-04-05 | Discharge: 2019-04-05 | Disposition: A | Discharge status: Home / Self Care | Payer: BC Managed Care – PPO | Source: Ambulatory Visit | Attending: Obstetrics and Gynecology | Admitting: Obstetrics and Gynecology

## 2019-04-05 DIAGNOSIS — Z1231 Encounter for screening mammogram for malignant neoplasm of breast: Secondary | ICD-10-CM | POA: Diagnosis not present

## 2019-04-05 DIAGNOSIS — Z1239 Encounter for other screening for malignant neoplasm of breast: Secondary | ICD-10-CM

## 2019-07-12 IMAGING — MG MM DIGITAL SCREENING BILAT W/ TOMO W/ CAD
9 of 14 series · 9 of 30 positions shown · non-contrast
Comparison: Previous exam(s).

CLINICAL DATA: Screening.

EXAM:
2D DIGITAL SCREENING BILATERAL MAMMOGRAM WITH CAD AND ADJUNCT TOMO

[L MLO (1 of 2)]
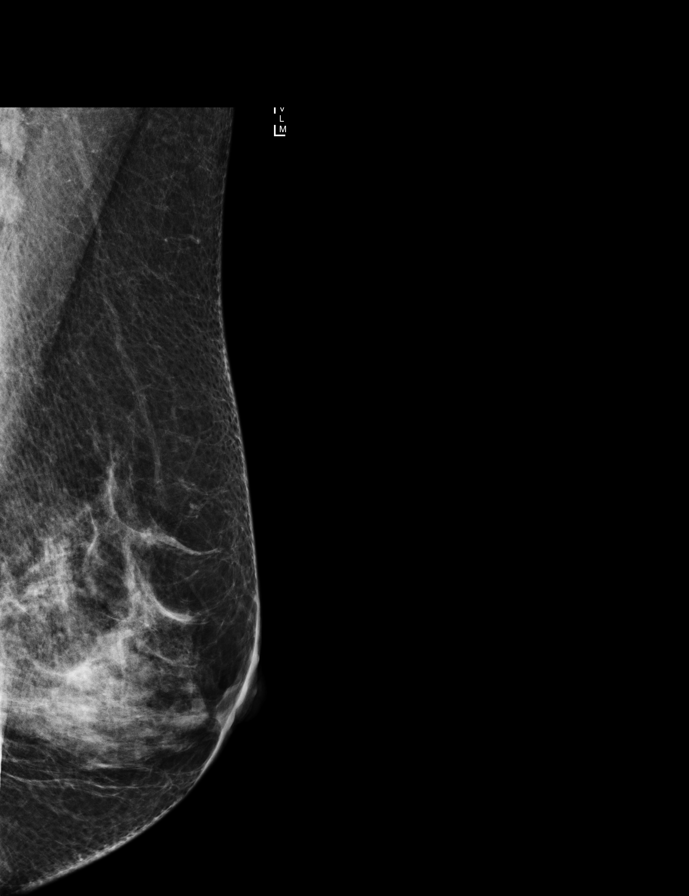

[R XCCL]
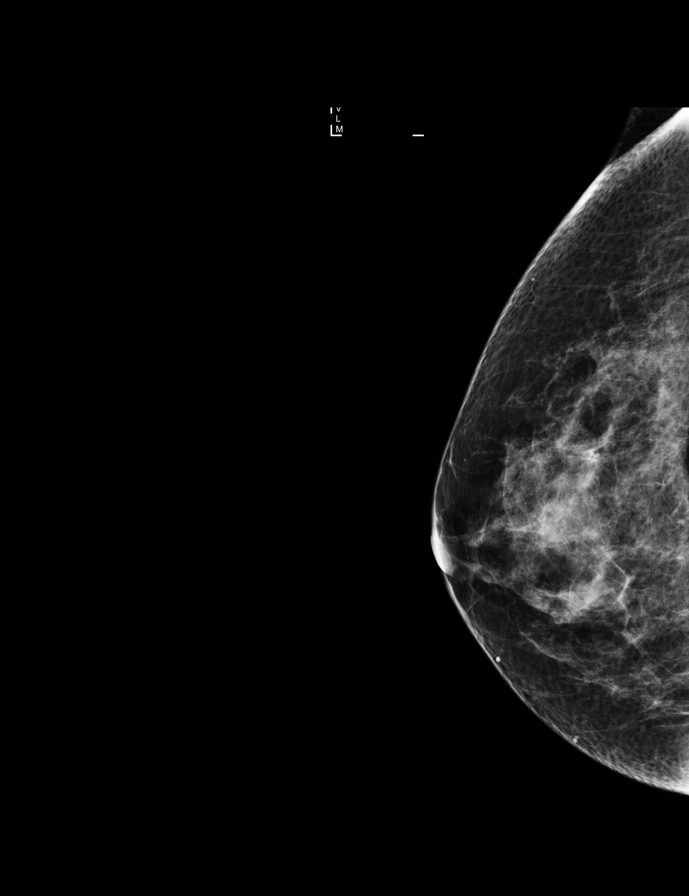

[L CC]
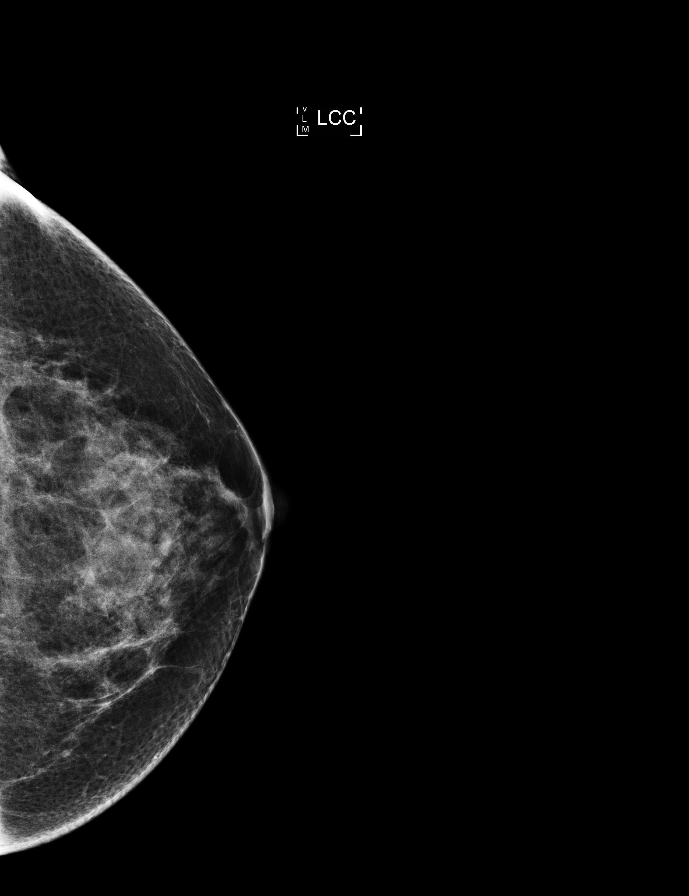

[L MLO (2 of 2)]
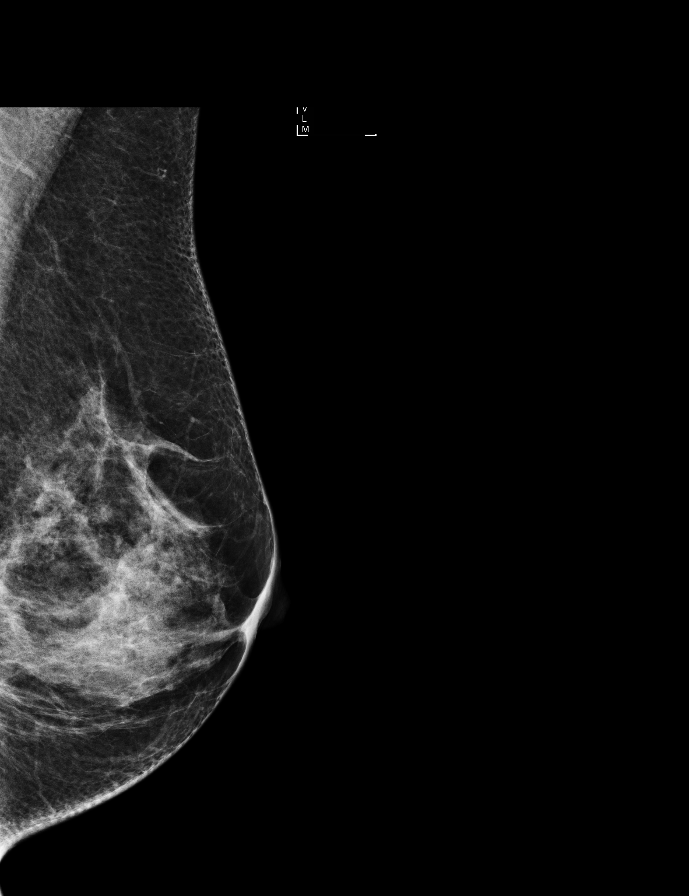

[L MLO synth-2D]
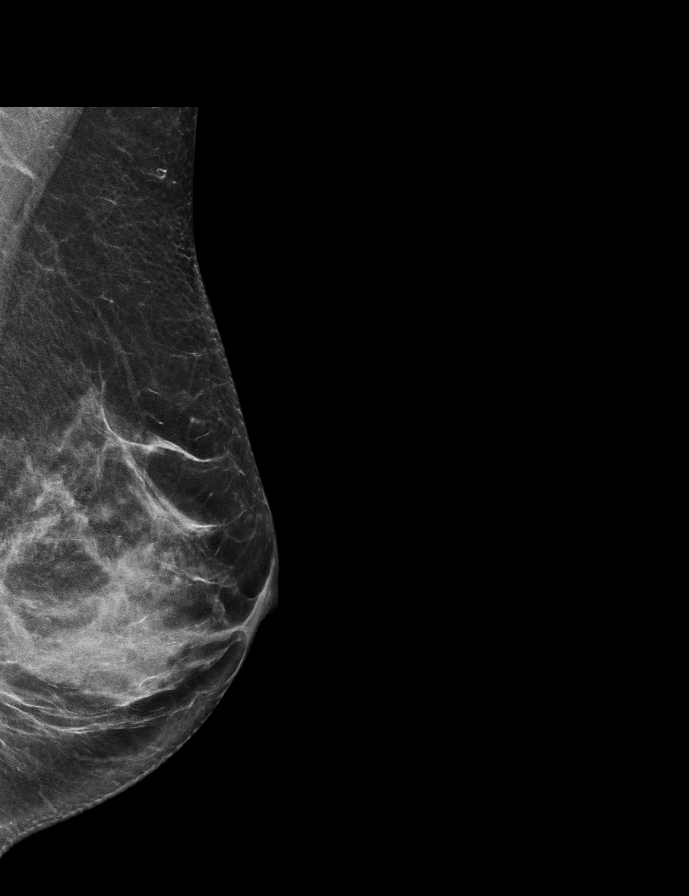

[L CC synth-2D]
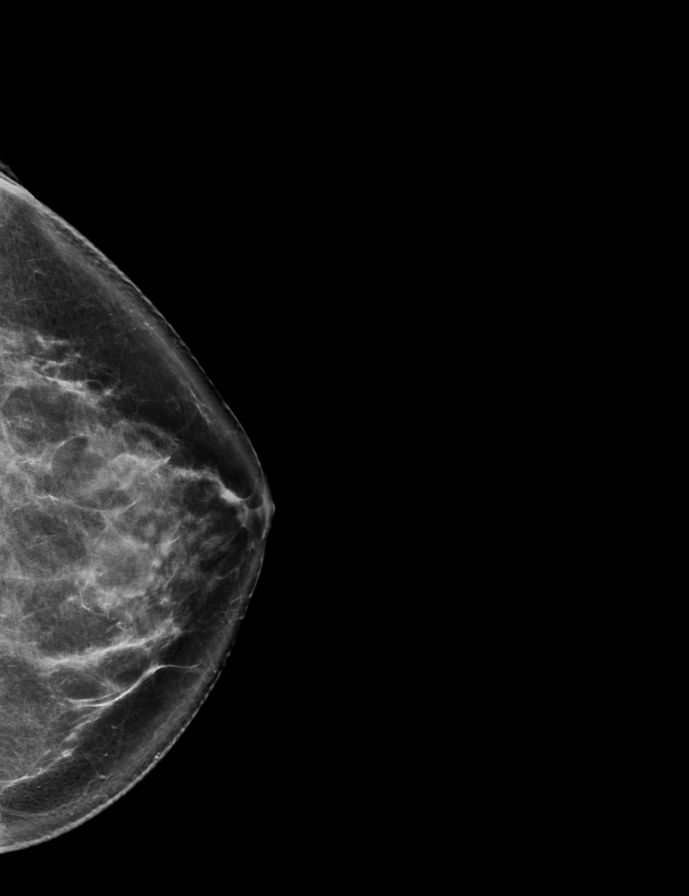

[R CC synth-2D]
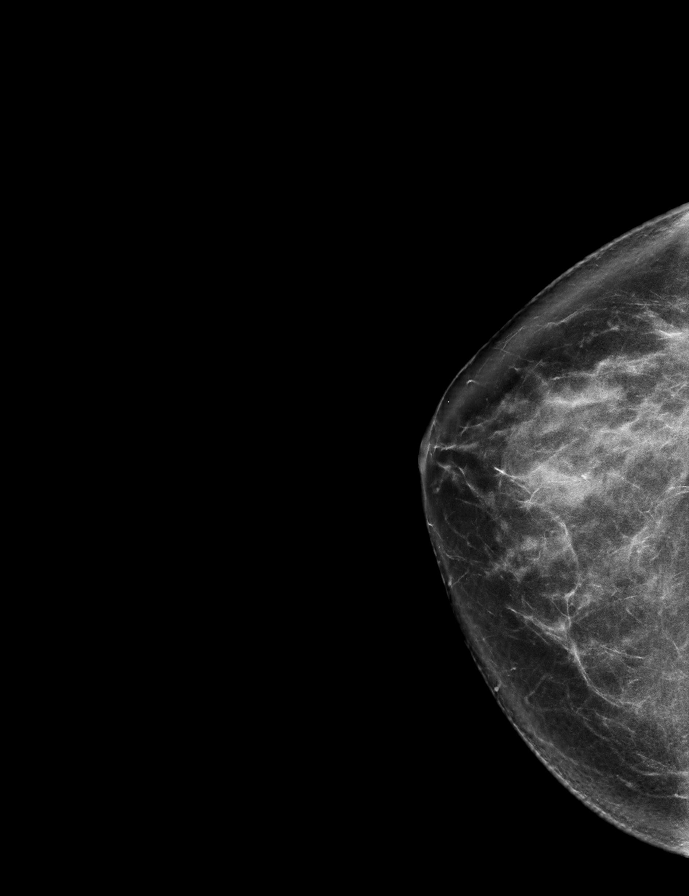

[R CC]
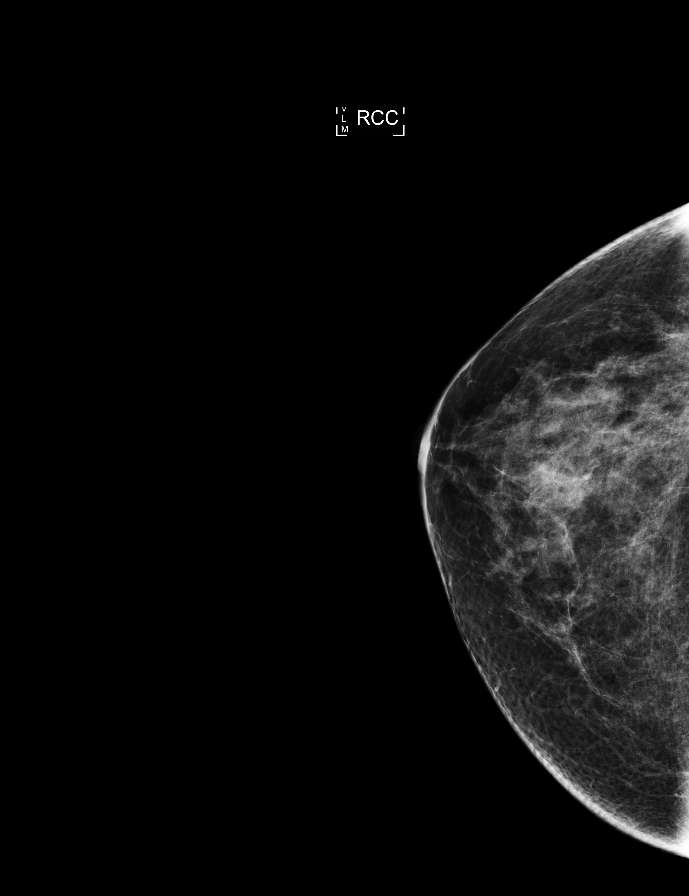

[R MLO]
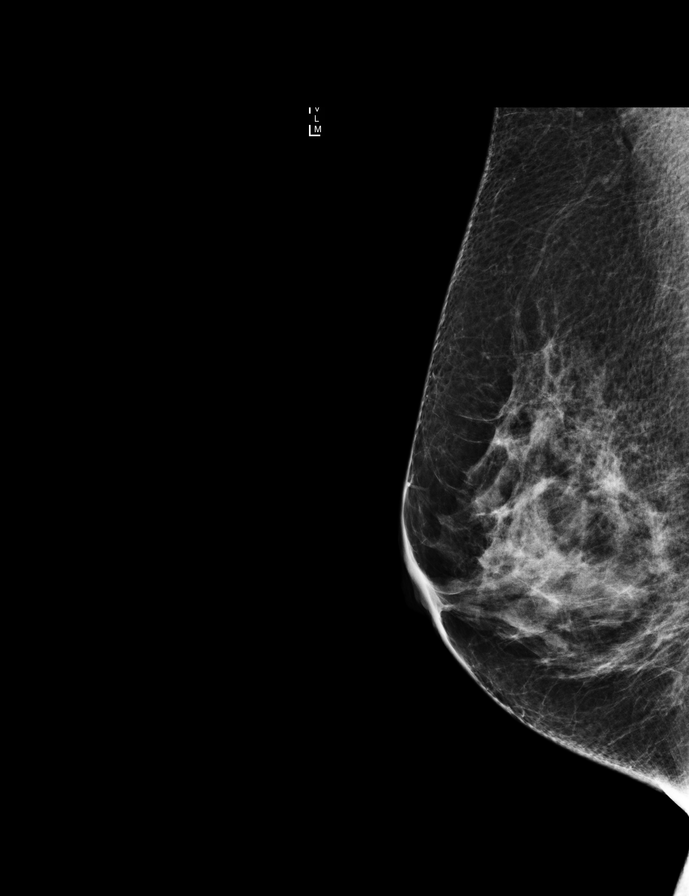

[9 of 30 positions shown; findings below may reference images not displayed]

ACR Breast Density Category c: The breast tissue is heterogeneously
dense, which may obscure small masses.
FINDINGS: There are no findings suspicious for malignancy. Images were
processed with CAD.
IMPRESSION: No mammographic evidence of malignancy. A result letter of this
screening mammogram will be mailed directly to the patient.

RECOMMENDATION:
Screening mammogram in one year. (Code:TN-[id number])

BI-RADS CATEGORY  1: Negative.

## 2020-02-29 NOTE — Patient Instructions (Addendum)
I value your feedback and entrusting us with your care. If you get a [facility] patient survey, I would appreciate you taking the time to let us know about your experience today. Thank you! ° °As of June 24, 2019, your lab results will be released to your MyChart immediately, before I even have a chance to see them. Please give me time to review them and contact you if there are any abnormalities. Thank you for your patience.  ° °[facility]: [phone] ° ° ° °

## 2020-02-29 NOTE — Progress Notes (Signed)
Chief Complaint  Patient presents with   Gynecologic Exam     HPI:      [name] is a 58 y.o. G1P1001 who LMP was No LMP recorded. Patient is postmenopausal., presents today for her annual examination.  Her menses are absent due to menopause. She does not have intermenstrual bleeding. She does have vasomotor sx occasionally that are tolerable.  Sex activity: not sexually active. She does not have vaginal dryness.  Last Pap: 01/20/17 Results were: no abnormalities /neg HPV DNA.  Hx of STDs: none  Last mammogram: 04/05/19  Results were: normal--routine follow-up in 12 months There is a FH of breast cancer in her PGM, genetic testing not indicated. There is no FH of ovarian cancer. The patient does not do self-breast exams.  Colonoscopy: colonoscopy 2017 with [facility] with polyps; pt unsure when due for repeat and will check her records (I can't find it in [facility] notes)  Tobacco use: The patient denies current or previous tobacco use. Alcohol use: none No drug use Exercise: not active  She does get adequate calcium and Vitamin D in her diet.  Didn't do fasting labs last 2 yrs, will call for labs if desires.  Pt does tanning bed. Hasn't seen derm in a few yrs for mole check.  Past Medical History:  Diagnosis Date   Fibroid    History of mammogram 03/11/2015; 03/11/16   birad I; neg   History of Papanicolaou smear of cervix 01/05/2014   -/-    Past Surgical History:  Procedure Laterality Date   COLONOSCOPY N/A 05/03/2016   Procedure: COLONOSCOPY;  Surgeon: Lollie Sails, MD;  Location: [facility] ENDOSCOPY;  Service: Endoscopy;  Laterality: N/A;   COLONOSCOPY  04/2016   non-cancerous polyp; Dr. Vira Agar; repeat in 5-92yrs    Family History  Problem Relation Age of Onset   Breast cancer Paternal Grandmother 25   Hypertension Brother    Cancer Maternal Grandfather 79    Social History   Socioeconomic History   Marital status: Divorced    Spouse  name: Not on file   Number of children: Not on file   Years of education: Not on file   Highest education level: Not on file  Occupational History   Not on file  Tobacco Use   Smoking status: Former Smoker   Smokeless tobacco: Never Used  Scientific laboratory technician Use: Never used  Substance and Sexual Activity   Alcohol use: Yes    Comment: rare   Drug use: No   Sexual activity: Not Currently    Birth control/protection: None, Post-menopausal  Other Topics Concern   Not on file  Social History Narrative   Not on file   Social Determinants of Health   Financial Resource Strain:    Difficulty of Paying Living Expenses:   Food Insecurity:    Worried About Charity fundraiser in the Last Year:    Arboriculturist in the Last Year:   Transportation Needs:    Film/video editor (Medical):    Lack of Transportation (Non-Medical):   Physical Activity:    Days of Exercise per Week:    Minutes of Exercise per Session:   Stress:    Feeling of Stress :   Social Connections:    Frequency of Communication with Friends and Family:    Frequency of Social Gatherings with Friends and Family:    Attends Religious Services:    Active Member of  Clubs or Organizations:    Attends Music therapist:    Marital Status:   Intimate Partner Violence:    Fear of Current or Ex-Partner:    Emotionally Abused:    Physically Abused:    Sexually Abused:     No current outpatient medications on file prior to visit.   No current facility-administered medications on file prior to visit.    ROS:  Review of Systems  Constitutional: Negative for fatigue, fever and unexpected weight change.  Respiratory: Negative for cough, shortness of breath and wheezing.   Cardiovascular: Negative for chest pain, palpitations and leg swelling.  Gastrointestinal: Negative for blood in stool, constipation, diarrhea, nausea and vomiting.  Endocrine: Negative for cold  intolerance, heat intolerance and polyuria.  Genitourinary: Negative for dyspareunia, dysuria, flank pain, frequency, genital sores, hematuria, menstrual problem, pelvic pain, urgency, vaginal bleeding, vaginal discharge and vaginal pain.  Musculoskeletal: Positive for arthralgias. Negative for back pain, joint swelling and myalgias.  Skin: Negative for rash.  Neurological: Negative for dizziness, syncope, light-headedness, numbness and headaches.  Hematological: Negative for adenopathy.  Psychiatric/Behavioral: Negative for agitation, confusion, sleep disturbance and suicidal ideas. The patient is not nervous/anxious.      Objective: BP 114/70    Ht 5\' 7"  (1.702 m)    Wt 205 lb (93 kg)    BMI 32.11 kg/m    Physical Exam Constitutional:      Appearance: She is well-developed.  Genitourinary:     Vulva, vagina, uterus, right adnexa and left adnexa normal.     No vulval lesion or tenderness noted.     No vaginal discharge, erythema or tenderness.     No cervical motion tenderness or polyp.     Uterus is not enlarged or tender.     No right or left adnexal mass present.     Right adnexa not tender.     Left adnexa not tender.  Neck:     Thyroid: No thyromegaly.  Cardiovascular:     Rate and Rhythm: Normal rate and regular rhythm.     Heart sounds: Normal heart sounds. No murmur heard.   Pulmonary:     Effort: Pulmonary effort is normal.     Breath sounds: Normal breath sounds.  Chest:     Breasts:        Right: No mass, nipple discharge, skin change or tenderness.        Left: No mass, nipple discharge, skin change or tenderness.  Abdominal:     Palpations: Abdomen is soft.     Tenderness: There is no abdominal tenderness. There is no guarding.  Musculoskeletal:        General: Normal range of motion.     Cervical back: Normal range of motion.  Neurological:     General: No focal deficit present.     Mental Status: She is alert and oriented to person, place, and time.      Cranial Nerves: No cranial nerve deficit.  Skin:    General: Skin is warm and dry.       Psychiatric:        Mood and Affect: Mood normal.        Behavior: Behavior normal.        Thought Content: Thought content normal.        Judgment: Judgment normal.  Vitals reviewed.     Assessment/Plan: Encounter for annual routine gynecological examination  Encounter for screening mammogram for malignant neoplasm of breast - Plan: MM  3D SCREEN BREAST BILATERAL; pt to sched mammo  Skin lesion--RT upper chest. Recommend skin check with derm. Question AK vs early SCC.  Screening for colon cancer--pt to check when next colonoscopy due  Blood tests for routine general physical examination--pt to call for lab orders if desires.            GYN counsel mammography screening,  adequate intake of calcium and vitamin D, diet and exercise     F/U  Return in about 1 year (around 03/01/2021).  Lavern Crimi B. Baani Bober, PA-C 03/01/2020 8:54 AM

## 2020-03-01 ENCOUNTER — Ambulatory Visit (INDEPENDENT_AMBULATORY_CARE_PROVIDER_SITE_OTHER): Payer: BC Managed Care – PPO | Admitting: Obstetrics and Gynecology

## 2020-03-01 ENCOUNTER — Encounter: Payer: Self-pay | Admitting: Obstetrics and Gynecology

## 2020-03-01 ENCOUNTER — Other Ambulatory Visit: Payer: Self-pay

## 2020-03-01 VITALS — BP 114/70 | Ht 67.0 in | Wt 205.0 lb

## 2020-03-01 DIAGNOSIS — L989 Disorder of the skin and subcutaneous tissue, unspecified: Secondary | ICD-10-CM | POA: Diagnosis not present

## 2020-03-01 DIAGNOSIS — Z1231 Encounter for screening mammogram for malignant neoplasm of breast: Secondary | ICD-10-CM | POA: Diagnosis not present

## 2020-03-01 DIAGNOSIS — Z0000 Encounter for general adult medical examination without abnormal findings: Secondary | ICD-10-CM

## 2020-03-01 DIAGNOSIS — Z01419 Encounter for gynecological examination (general) (routine) without abnormal findings: Secondary | ICD-10-CM

## 2020-03-01 DIAGNOSIS — Z1211 Encounter for screening for malignant neoplasm of colon: Secondary | ICD-10-CM | POA: Diagnosis not present

## 2020-04-05 ENCOUNTER — Other Ambulatory Visit: Payer: Self-pay

## 2020-04-05 ENCOUNTER — Ambulatory Visit
Admission: RE | Admit: 2020-04-05 | Discharge: 2020-04-05 | Disposition: A | Discharge status: Home / Self Care | Payer: BC Managed Care – PPO | Source: Ambulatory Visit | Attending: Obstetrics and Gynecology | Admitting: Obstetrics and Gynecology

## 2020-04-05 DIAGNOSIS — Z1231 Encounter for screening mammogram for malignant neoplasm of breast: Secondary | ICD-10-CM | POA: Insufficient documentation

## 2021-03-01 NOTE — Progress Notes (Signed)
Chief Complaint  Patient presents with   Gynecologic Exam    No concerns     HPI:      [name] is a 60 y.o. G1P1001 who LMP was No LMP recorded. Patient is postmenopausal., presents today for her annual examination.  Her menses are absent due to menopause. She does not have PMB. She does have vasomotor sx occasionally that are tolerable.   Sex activity: not sexually active. She does not have vaginal dryness.   Last Pap: 01/20/17 Results were: no abnormalities /neg HPV DNA.  Hx of STDs: none   Last mammogram: 04/05/20 Results were: normal--routine follow-up in 12 months There is a FH of breast cancer in her PGM, genetic testing not indicated. There is no FH of ovarian cancer. The patient does not do self-breast exams.   Colonoscopy: colonoscopy 04/2016 with [facility] with hyperplastic polyp; pt unsure when due for repeat and will check her records (I can't find it in [facility] notes) but ? 10 yrs per guidelines   Tobacco use: The patient denies current or previous tobacco use. Alcohol use: none No drug use Exercise: not active   She does get adequate calcium and Vitamin D in her diet.   Didn't do fasting labs recently, will do this yr Pt does tanning bed. Hasn't seen derm in a few yrs for mole check.  Past Medical History:  Diagnosis Date   Fibroid    History of mammogram 03/11/2015; 03/11/16   birad I; neg   History of Papanicolaou smear of cervix 01/05/2014   -/-    Past Surgical History:  Procedure Laterality Date   COLONOSCOPY N/A 05/03/2016   Procedure: COLONOSCOPY;  Surgeon: Lollie Sails, MD;  Location: [facility] ENDOSCOPY;  Service: Endoscopy;  Laterality: N/A;   COLONOSCOPY  04/2016   non-cancerous polyp; Dr. Vira Agar; repeat in 5-76yr    Family History  Problem Relation Age of Onset   Breast cancer Paternal Grandmother 739  Hypertension Brother    Cancer Maternal Grandfather 668   Social History   Socioeconomic History   Marital status: Divorced     Spouse name: Not on file   Number of children: Not on file   Years of education: Not on file   Highest education level: Not on file  Occupational History   Not on file  Tobacco Use   Smoking status: Former   Smokeless tobacco: Never  Vaping Use   Vaping Use: Never used  Substance and Sexual Activity   Alcohol use: Yes    Comment: rare   Drug use: No   Sexual activity: Not Currently    Birth control/protection: None, Post-menopausal  Other Topics Concern   Not on file  Social History Narrative   Not on file   Social Determinants of Health   Financial Resource Strain: Not on file  Food Insecurity: Not on file  Transportation Needs: Not on file  Physical Activity: Not on file  Stress: Not on file  Social Connections: Not on file  Intimate Partner Violence: Not on file    No current outpatient medications on file prior to visit.   No current facility-administered medications on file prior to visit.    ROS:  Review of Systems  Constitutional:  Negative for fatigue, fever and unexpected weight change.  Respiratory:  Negative for cough, shortness of breath and wheezing.   Cardiovascular:  Negative for chest pain, palpitations and leg swelling.  Gastrointestinal:  Negative for blood in  stool, constipation, diarrhea, nausea and vomiting.  Endocrine: Negative for cold intolerance, heat intolerance and polyuria.  Genitourinary:  Negative for dyspareunia, dysuria, flank pain, frequency, genital sores, hematuria, menstrual problem, pelvic pain, urgency, vaginal bleeding, vaginal discharge and vaginal pain.  Musculoskeletal:  Positive for arthralgias. Negative for back pain, joint swelling and myalgias.  Skin:  Negative for rash.  Neurological:  Negative for dizziness, syncope, light-headedness, numbness and headaches.  Hematological:  Negative for adenopathy.  Psychiatric/Behavioral:  Negative for agitation, confusion, sleep disturbance and suicidal ideas. The patient is not  nervous/anxious.     Objective: BP 124/80   Ht 5' 6.75" (1.695 m)   Wt 201 lb (91.2 kg)   BMI 31.72 kg/m    Physical Exam Constitutional:      Appearance: She is well-developed.  Genitourinary:     Vulva normal.     Right Labia: No rash, tenderness or lesions.    Left Labia: No tenderness, lesions or rash.    No vaginal discharge, erythema or tenderness.      Right Adnexa: not tender and no mass present.    Left Adnexa: not tender and no mass present.    No cervical friability or polyp.     Uterus is not enlarged or tender.  Breasts:    Right: No mass, nipple discharge, skin change or tenderness.     Left: No mass, nipple discharge, skin change or tenderness.  Neck:     Thyroid: No thyromegaly.  Cardiovascular:     Rate and Rhythm: Normal rate and regular rhythm.     Heart sounds: Normal heart sounds. No murmur heard. Pulmonary:     Effort: Pulmonary effort is normal.     Breath sounds: Normal breath sounds.  Abdominal:     Palpations: Abdomen is soft.     Tenderness: There is no abdominal tenderness. There is no guarding or rebound.  Musculoskeletal:        General: Normal range of motion.     Cervical back: Normal range of motion.  Lymphadenopathy:     Cervical: No cervical adenopathy.  Neurological:     General: No focal deficit present.     Mental Status: She is alert and oriented to person, place, and time.     Cranial Nerves: No cranial nerve deficit.  Skin:    General: Skin is warm and dry.  Psychiatric:        Mood and Affect: Mood normal.        Behavior: Behavior normal.        Thought Content: Thought content normal.        Judgment: Judgment normal.  Vitals reviewed.    Assessment/Plan:  Encounter for annual routine gynecological examination  Encounter for screening mammogram for malignant neoplasm of breast - Plan: MM 3D SCREEN BREAST BILATERAL; pt to sched mammo  Blood tests for routine general physical examination - Plan: Comprehensive  metabolic panel, Lipid panel  Screening cholesterol level - Plan: Lipid panel  BMI 31.0-31.9,adult - Plan: Comprehensive metabolic panel, Lipid panel           GYN counsel mammography screening,  adequate intake of calcium and vitamin D, diet and exercise     F/U  Return in about 1 year (around 03/05/2022).  Letonia Stead B. Lydian Chavous, PA-C 03/05/2021 8:31 AM

## 2021-03-05 ENCOUNTER — Other Ambulatory Visit: Payer: Self-pay

## 2021-03-05 ENCOUNTER — Ambulatory Visit (INDEPENDENT_AMBULATORY_CARE_PROVIDER_SITE_OTHER): Payer: BC Managed Care – PPO | Admitting: Obstetrics and Gynecology

## 2021-03-05 ENCOUNTER — Encounter: Payer: Self-pay | Admitting: Obstetrics and Gynecology

## 2021-03-05 VITALS — BP 124/80 | Ht 66.75 in | Wt 201.0 lb

## 2021-03-05 DIAGNOSIS — Z01419 Encounter for gynecological examination (general) (routine) without abnormal findings: Secondary | ICD-10-CM

## 2021-03-05 DIAGNOSIS — Z13220 Encounter for screening for lipoid disorders: Secondary | ICD-10-CM | POA: Diagnosis not present

## 2021-03-05 DIAGNOSIS — Z1231 Encounter for screening mammogram for malignant neoplasm of breast: Secondary | ICD-10-CM

## 2021-03-05 DIAGNOSIS — Z0000 Encounter for general adult medical examination without abnormal findings: Secondary | ICD-10-CM

## 2021-03-05 DIAGNOSIS — Z6831 Body mass index (BMI) 31.0-31.9, adult: Secondary | ICD-10-CM

## 2021-03-05 DIAGNOSIS — Z1211 Encounter for screening for malignant neoplasm of colon: Secondary | ICD-10-CM

## 2021-03-05 NOTE — Patient Instructions (Signed)
I value your feedback and you entrusting us with your care. If you get a [facility] patient survey, I would appreciate you taking the time to let us know about your experience today. Thank you!  [facility]: [phone]      

## 2021-04-10 ENCOUNTER — Ambulatory Visit
Admission: RE | Admit: 2021-04-10 | Discharge: 2021-04-10 | Disposition: A | Discharge status: Home / Self Care | Payer: BC Managed Care – PPO | Source: Ambulatory Visit | Attending: Obstetrics and Gynecology | Admitting: Obstetrics and Gynecology

## 2021-04-10 ENCOUNTER — Other Ambulatory Visit: Payer: Self-pay

## 2021-04-10 DIAGNOSIS — Z1231 Encounter for screening mammogram for malignant neoplasm of breast: Secondary | ICD-10-CM

## 2022-05-19 NOTE — Progress Notes (Unsigned)
No chief complaint on file.    HPI:      [name] is a 61 y.o. G1P1001 who LMP was No LMP recorded. Patient is postmenopausal., presents today for her annual examination.  Her menses are absent due to menopause. She does not have PMB. She does have vasomotor sx occasionally that are tolerable.   Sex activity: not sexually active. She does not have vaginal dryness.   Last Pap: 01/20/17 Results were: no abnormalities /neg HPV DNA.  Hx of STDs: none   Last mammogram: 04/10/21  Results were: normal--routine follow-up in 12 months There is a FH of breast cancer in her PGM, genetic testing not indicated. There is no FH of ovarian cancer. The patient does not do self-breast exams.   Colonoscopy: colonoscopy 04/2016 with [facility] with hyperplastic polyp; pt unsure when due for repeat and will check her records (I can't find it in [facility] notes) but ? 10 yrs per guidelines   Tobacco use: The patient denies current or previous tobacco use. Alcohol use: none No drug use Exercise: not active   She does get adequate calcium and Vitamin D in her diet.   Didn't do fasting labs recently, will do this yr Pt does tanning bed. Hasn't seen derm in a few yrs for mole check.  Past Medical History:  Diagnosis Date   Fibroid    History of mammogram 03/11/2015; 03/11/16   birad I; neg   History of Papanicolaou smear of cervix 01/05/2014   -/-    Past Surgical History:  Procedure Laterality Date   COLONOSCOPY N/A 05/03/2016   Procedure: COLONOSCOPY;  Surgeon: Lollie Sails, MD;  Location: [facility] ENDOSCOPY;  Service: Endoscopy;  Laterality: N/A;   COLONOSCOPY  04/2016   non-cancerous polyp; Dr. Vira Agar; repeat in 5-70yr    Family History  Problem Relation Age of Onset   Breast cancer Paternal Grandmother 766  Hypertension Brother    Cancer Maternal Grandfather 653   Social History   Socioeconomic History   Marital status: Divorced    Spouse name: Not on file   Number of children:  Not on file   Years of education: Not on file   Highest education level: Not on file  Occupational History   Not on file  Tobacco Use   Smoking status: Former   Smokeless tobacco: Never  Vaping Use   Vaping Use: Never used  Substance and Sexual Activity   Alcohol use: Yes    Comment: rare   Drug use: No   Sexual activity: Not Currently    Birth control/protection: None, Post-menopausal  Other Topics Concern   Not on file  Social History Narrative   Not on file   Social Determinants of Health   Financial Resource Strain: Not on file  Food Insecurity: Not on file  Transportation Needs: Not on file  Physical Activity: Not on file  Stress: Not on file  Social Connections: Not on file  Intimate Partner Violence: Not on file    No current outpatient medications on file prior to visit.   No current facility-administered medications on file prior to visit.    ROS:  Review of Systems  Constitutional:  Negative for fatigue, fever and unexpected weight change.  Respiratory:  Negative for cough, shortness of breath and wheezing.   Cardiovascular:  Negative for chest pain, palpitations and leg swelling.  Gastrointestinal:  Negative for blood in stool, constipation, diarrhea, nausea and vomiting.  Endocrine: Negative for  cold intolerance, heat intolerance and polyuria.  Genitourinary:  Negative for dyspareunia, dysuria, flank pain, frequency, genital sores, hematuria, menstrual problem, pelvic pain, urgency, vaginal bleeding, vaginal discharge and vaginal pain.  Musculoskeletal:  Positive for arthralgias. Negative for back pain, joint swelling and myalgias.  Skin:  Negative for rash.  Neurological:  Negative for dizziness, syncope, light-headedness, numbness and headaches.  Hematological:  Negative for adenopathy.  Psychiatric/Behavioral:  Negative for agitation, confusion, sleep disturbance and suicidal ideas. The patient is not nervous/anxious.      Objective: There were no  vitals taken for this visit.   Physical Exam Constitutional:      Appearance: She is well-developed.  Genitourinary:     Vulva normal.     Right Labia: No rash, tenderness or lesions.    Left Labia: No tenderness, lesions or rash.    No vaginal discharge, erythema or tenderness.      Right Adnexa: not tender and no mass present.    Left Adnexa: not tender and no mass present.    No cervical friability or polyp.     Uterus is not enlarged or tender.  Breasts:    Right: No mass, nipple discharge, skin change or tenderness.     Left: No mass, nipple discharge, skin change or tenderness.  Neck:     Thyroid: No thyromegaly.  Cardiovascular:     Rate and Rhythm: Normal rate and regular rhythm.     Heart sounds: Normal heart sounds. No murmur heard. Pulmonary:     Effort: Pulmonary effort is normal.     Breath sounds: Normal breath sounds.  Abdominal:     Palpations: Abdomen is soft.     Tenderness: There is no abdominal tenderness. There is no guarding or rebound.  Musculoskeletal:        General: Normal range of motion.     Cervical back: Normal range of motion.  Lymphadenopathy:     Cervical: No cervical adenopathy.  Neurological:     General: No focal deficit present.     Mental Status: She is alert and oriented to person, place, and time.     Cranial Nerves: No cranial nerve deficit.  Skin:    General: Skin is warm and dry.  Psychiatric:        Mood and Affect: Mood normal.        Behavior: Behavior normal.        Thought Content: Thought content normal.        Judgment: Judgment normal.  Vitals reviewed.     Assessment/Plan:  Encounter for annual routine gynecological examination  Encounter for screening mammogram for malignant neoplasm of breast - Plan: MM 3D SCREEN BREAST BILATERAL; pt to sched mammo  Blood tests for routine general physical examination - Plan: Comprehensive metabolic panel, Lipid panel  Screening cholesterol level - Plan: Lipid panel  BMI  31.0-31.9,adult - Plan: Comprehensive metabolic panel, Lipid panel           GYN counsel mammography screening,  adequate intake of calcium and vitamin D, diet and exercise     F/U  No follow-ups on file.  Renee Holbein B. Shekelia Boutin, PA-C 05/19/2022 9:01 PM

## 2022-05-21 ENCOUNTER — Encounter: Payer: Self-pay | Admitting: Obstetrics and Gynecology

## 2022-05-21 ENCOUNTER — Other Ambulatory Visit (HOSPITAL_COMMUNITY)
Admission: RE | Admit: 2022-05-21 | Discharge: 2022-05-21 | Disposition: A | Discharge status: Home / Self Care | Payer: BC Managed Care – PPO | Source: Ambulatory Visit | Attending: Obstetrics and Gynecology | Admitting: Obstetrics and Gynecology

## 2022-05-21 ENCOUNTER — Ambulatory Visit (INDEPENDENT_AMBULATORY_CARE_PROVIDER_SITE_OTHER): Payer: BC Managed Care – PPO | Admitting: Obstetrics and Gynecology

## 2022-05-21 VITALS — BP 110/70 | Ht 67.0 in | Wt 209.0 lb

## 2022-05-21 DIAGNOSIS — Z0000 Encounter for general adult medical examination without abnormal findings: Secondary | ICD-10-CM

## 2022-05-21 DIAGNOSIS — K219 Gastro-esophageal reflux disease without esophagitis: Secondary | ICD-10-CM | POA: Diagnosis not present

## 2022-05-21 DIAGNOSIS — Z124 Encounter for screening for malignant neoplasm of cervix: Secondary | ICD-10-CM | POA: Insufficient documentation

## 2022-05-21 DIAGNOSIS — Z131 Encounter for screening for diabetes mellitus: Secondary | ICD-10-CM | POA: Diagnosis not present

## 2022-05-21 DIAGNOSIS — Z1231 Encounter for screening mammogram for malignant neoplasm of breast: Secondary | ICD-10-CM

## 2022-05-21 DIAGNOSIS — Z01419 Encounter for gynecological examination (general) (routine) without abnormal findings: Secondary | ICD-10-CM

## 2022-05-21 DIAGNOSIS — Z1151 Encounter for screening for human papillomavirus (HPV): Secondary | ICD-10-CM | POA: Insufficient documentation

## 2022-05-21 DIAGNOSIS — Z1211 Encounter for screening for malignant neoplasm of colon: Secondary | ICD-10-CM

## 2022-05-21 DIAGNOSIS — Z13220 Encounter for screening for lipoid disorders: Secondary | ICD-10-CM

## 2022-05-21 DIAGNOSIS — Z6832 Body mass index (BMI) 32.0-32.9, adult: Secondary | ICD-10-CM

## 2022-05-21 NOTE — Patient Instructions (Signed)
I value your feedback and you entrusting us with your care. If you get a [facility] patient survey, I would appreciate you taking the time to let us know about your experience today. Thank you!  [facility]: [phone]      

## 2022-05-23 LAB — CYTOLOGY - PAP
Comment: NEGATIVE
Diagnosis: NEGATIVE
High risk HPV: NEGATIVE

## 2022-06-05 ENCOUNTER — Other Ambulatory Visit: Payer: BC Managed Care – PPO

## 2022-06-05 DIAGNOSIS — Z0000 Encounter for general adult medical examination without abnormal findings: Secondary | ICD-10-CM

## 2022-06-05 DIAGNOSIS — Z131 Encounter for screening for diabetes mellitus: Secondary | ICD-10-CM

## 2022-06-05 DIAGNOSIS — Z13220 Encounter for screening for lipoid disorders: Secondary | ICD-10-CM | POA: Diagnosis not present

## 2022-06-05 DIAGNOSIS — Z6832 Body mass index (BMI) 32.0-32.9, adult: Secondary | ICD-10-CM

## 2022-06-06 LAB — LIPID PANEL
Chol/HDL Ratio: 2.3 ratio (ref 0.0–4.4)
Cholesterol, Total: 161 mg/dL (ref 100–199)
HDL: 69 mg/dL (ref >39)
LDL Chol Calc (NIH): 79 mg/dL (ref 0–99)
Triglycerides: 69 mg/dL (ref 0–149)
VLDL Cholesterol Cal: 13 mg/dL (ref 5–40)

## 2022-06-06 LAB — COMPREHENSIVE METABOLIC PANEL
ALT: 19 IU/L (ref 0–32)
AST: 22 IU/L (ref 0–40)
Albumin/Globulin Ratio: 1.3 (ref 1.2–2.2)
Albumin: 4.3 g/dL (ref 3.8–4.9)
Alkaline Phosphatase: 111 IU/L (ref 44–121)
BUN/Creatinine Ratio: 24 (ref 12–28)
BUN: 16 mg/dL (ref 8–27)
Bilirubin Total: 0.6 mg/dL (ref 0.0–1.2)
CO2: 24 mmol/L (ref 20–29)
Calcium: 9.7 mg/dL (ref 8.7–10.3)
Chloride: 102 mmol/L (ref 96–106)
Creatinine, Ser: 0.66 mg/dL (ref 0.57–1.00)
Globulin, Total: 3.2 g/dL (ref 1.5–4.5)
Glucose: 96 mg/dL (ref 70–99)
Potassium: 4 mmol/L (ref 3.5–5.2)
Sodium: 140 mmol/L (ref 134–144)
Total Protein: 7.5 g/dL (ref 6.0–8.5)
eGFR: 100 mL/min/{1.73_m2} (ref >59)

## 2022-06-06 LAB — HEMOGLOBIN A1C
Est. average glucose Bld gHb Est-mCnc: 123 mg/dL
Hgb A1c MFr Bld: 5.9 % — ABNORMAL HIGH (ref 4.8–5.6)

## 2022-06-28 ENCOUNTER — Ambulatory Visit
Admission: RE | Admit: 2022-06-28 | Discharge: 2022-06-28 | Disposition: A | Discharge status: Home / Self Care | Payer: BC Managed Care – PPO | Source: Ambulatory Visit | Attending: Obstetrics and Gynecology | Admitting: Obstetrics and Gynecology

## 2022-06-28 DIAGNOSIS — Z1231 Encounter for screening mammogram for malignant neoplasm of breast: Secondary | ICD-10-CM | POA: Diagnosis not present

## 2023-05-26 NOTE — Progress Notes (Unsigned)
No chief complaint on file.    HPI:      [name] is a 62 y.o. G1P1001 who LMP was No LMP recorded. Patient is postmenopausal., presents today for her annual examination.  Her menses are absent due to menopause. She does not have PMB. She does have vasomotor sx occasionally that are tolerable.   Sex activity: not sexually active. She does not have vaginal dryness/sx.   Last Pap: 05/21/22 Results were: no abnormalities /neg HPV DNA.  Hx of STDs: none   Last mammogram: 06/28/22  Results were: normal--routine follow-up in 12 months There is a FH of breast cancer in her PGM, genetic testing not indicated. There is no FH of ovarian cancer. The patient does not do self-breast exams.   Colonoscopy: colonoscopy 04/2016 with [facility] with hyperplastic polyp; pt unsure when due for repeat and will check her records (I can't find it in [facility] notes) but ? 10 yrs per guidelines   Tobacco use: The patient denies current or previous tobacco use. Alcohol use: none No drug use Exercise: not active   She does get adequate calcium and Vitamin D in her diet.   Didn't do fasting labs recently, will do this yr Having issued with acid reflux more days than not, no meds to treat. No triggers.   Pt does tanning bed. Hasn't seen derm in a few yrs for mole check.  Past Medical History:  Diagnosis Date   Fibroid    History of mammogram 03/11/2015; 03/11/16   birad I; neg   History of Papanicolaou smear of cervix 01/05/2014   -/-    Past Surgical History:  Procedure Laterality Date   COLONOSCOPY N/A 05/03/2016   Procedure: COLONOSCOPY;  Surgeon: Christena Deem, MD;  Location: [facility] ENDOSCOPY;  Service: Endoscopy;  Laterality: N/A;   COLONOSCOPY  04/2016   non-cancerous polyp; Dr. Mechele Collin; repeat in 5-20yrs    Family History  Problem Relation Age of Onset   Breast cancer Paternal Grandmother 50   Hypertension Brother    Cancer Maternal Grandfather 85    Social History    Socioeconomic History   Marital status: Divorced    Spouse name: Not on file   Number of children: Not on file   Years of education: Not on file   Highest education level: Not on file  Occupational History   Not on file  Tobacco Use   Smoking status: Former   Smokeless tobacco: Never  Vaping Use   Vaping status: Never Used  Substance and Sexual Activity   Alcohol use: Yes    Comment: rare   Drug use: No   Sexual activity: Not Currently    Birth control/protection: None, Post-menopausal  Other Topics Concern   Not on file  Social History Narrative   Not on file   Social Determinants of Health   Financial Resource Strain: Not on file  Food Insecurity: Not on file  Transportation Needs: Not on file  Physical Activity: Not on file  Stress: Not on file  Social Connections: Not on file  Intimate Partner Violence: Not on file    No current outpatient medications on file prior to visit.   No current facility-administered medications on file prior to visit.    ROS:  Review of Systems  Constitutional:  Negative for fatigue, fever and unexpected weight change.  Respiratory:  Negative for cough, shortness of breath and wheezing.   Cardiovascular:  Negative for chest pain, palpitations and leg swelling.  Gastrointestinal:  Negative for blood in stool, constipation, diarrhea, nausea and vomiting.  Endocrine: Negative for cold intolerance, heat intolerance and polyuria.  Genitourinary:  Negative for dyspareunia, dysuria, flank pain, frequency, genital sores, hematuria, menstrual problem, pelvic pain, urgency, vaginal bleeding, vaginal discharge and vaginal pain.  Musculoskeletal:  Negative for arthralgias, back pain, joint swelling and myalgias.  Skin:  Negative for rash.  Neurological:  Negative for dizziness, syncope, light-headedness, numbness and headaches.  Hematological:  Negative for adenopathy.  Psychiatric/Behavioral:  Negative for agitation, confusion, sleep  disturbance and suicidal ideas. The patient is not nervous/anxious.      Objective: There were no vitals taken for this visit.   Physical Exam Constitutional:      Appearance: She is well-developed.  Genitourinary:     Vulva normal.     Right Labia: No rash, tenderness or lesions.    Left Labia: No tenderness, lesions or rash.    No vaginal discharge, erythema or tenderness.      Right Adnexa: not tender and no mass present.    Left Adnexa: not tender and no mass present.    No cervical friability or polyp.     Uterus is not enlarged or tender.  Breasts:    Right: No mass, nipple discharge, skin change or tenderness.     Left: No mass, nipple discharge, skin change or tenderness.  Neck:     Thyroid: No thyromegaly.  Cardiovascular:     Rate and Rhythm: Normal rate and regular rhythm.     Heart sounds: Normal heart sounds. No murmur heard. Pulmonary:     Effort: Pulmonary effort is normal.     Breath sounds: Normal breath sounds.  Abdominal:     Palpations: Abdomen is soft.     Tenderness: There is no abdominal tenderness. There is no guarding or rebound.  Musculoskeletal:        General: Normal range of motion.     Cervical back: Normal range of motion.  Lymphadenopathy:     Cervical: No cervical adenopathy.  Neurological:     General: No focal deficit present.     Mental Status: She is alert and oriented to person, place, and time.     Cranial Nerves: No cranial nerve deficit.  Skin:    General: Skin is warm and dry.  Psychiatric:        Mood and Affect: Mood normal.        Behavior: Behavior normal.        Thought Content: Thought content normal.        Judgment: Judgment normal.  Vitals reviewed.     Assessment/Plan: Encounter for annual routine gynecological examination  Cervical cancer screening - Plan: Cytology - PAP  Screening for HPV (human papillomavirus) - Plan: Cytology - PAP  Encounter for screening mammogram for malignant neoplasm of breast -  Plan: MM 3D SCREEN BREAST BILATERAL; pt to schedule mammo  Screening for colon cancer--pt to call [facility] to find out when next colonoscopy due  GERD--having reflux sx. Eat small meals, keep food diary for food triggers, add pepcid 20 mg daily. F/u prn.   Blood tests for routine general physical examination - Plan: Comprehensive metabolic panel, Lipid panel, Hemoglobin A1c; will RTO for fasting labs.   BMI 32.0-32.9,adult - Plan: Comprehensive metabolic panel, Lipid panel, Hemoglobin A1c  Screening cholesterol level - Plan: Lipid panel  Screening for diabetes mellitus - Plan: Hemoglobin A1c  Recommend derm appt for mole check  GYN counsel mammography screening,  adequate intake of calcium and vitamin D, diet and exercise     F/U  No follow-ups on file.  Beulah Matusek B. Ehsan Corvin, PA-C 05/26/2023 6:46 PM

## 2023-05-27 ENCOUNTER — Encounter: Payer: Self-pay | Admitting: Obstetrics and Gynecology

## 2023-05-27 ENCOUNTER — Ambulatory Visit (INDEPENDENT_AMBULATORY_CARE_PROVIDER_SITE_OTHER): Payer: BC Managed Care – PPO | Admitting: Obstetrics and Gynecology

## 2023-05-27 VITALS — BP 118/74 | Ht 66.75 in | Wt 195.0 lb

## 2023-05-27 DIAGNOSIS — Z13220 Encounter for screening for lipoid disorders: Secondary | ICD-10-CM

## 2023-05-27 DIAGNOSIS — Z131 Encounter for screening for diabetes mellitus: Secondary | ICD-10-CM

## 2023-05-27 DIAGNOSIS — K219 Gastro-esophageal reflux disease without esophagitis: Secondary | ICD-10-CM

## 2023-05-27 DIAGNOSIS — Z1211 Encounter for screening for malignant neoplasm of colon: Secondary | ICD-10-CM

## 2023-05-27 DIAGNOSIS — Z01419 Encounter for gynecological examination (general) (routine) without abnormal findings: Secondary | ICD-10-CM | POA: Diagnosis not present

## 2023-05-27 DIAGNOSIS — R7303 Prediabetes: Secondary | ICD-10-CM

## 2023-05-27 DIAGNOSIS — Z0000 Encounter for general adult medical examination without abnormal findings: Secondary | ICD-10-CM

## 2023-05-27 DIAGNOSIS — Z1231 Encounter for screening mammogram for malignant neoplasm of breast: Secondary | ICD-10-CM

## 2023-05-27 NOTE — Patient Instructions (Signed)
I value your feedback and you entrusting us with your care. If you get a [facility] patient survey, I would appreciate you taking the time to let us know about your experience today. Thank you! ? ? ?

## 2023-06-11 ENCOUNTER — Other Ambulatory Visit: Payer: BC Managed Care – PPO

## 2023-06-11 DIAGNOSIS — Z131 Encounter for screening for diabetes mellitus: Secondary | ICD-10-CM | POA: Diagnosis not present

## 2023-06-11 DIAGNOSIS — R7303 Prediabetes: Secondary | ICD-10-CM

## 2023-06-11 DIAGNOSIS — Z13220 Encounter for screening for lipoid disorders: Secondary | ICD-10-CM | POA: Diagnosis not present

## 2023-06-11 DIAGNOSIS — Z0000 Encounter for general adult medical examination without abnormal findings: Secondary | ICD-10-CM

## 2023-06-12 LAB — CBC WITH DIFFERENTIAL/PLATELET
Basophils Absolute: 0.1 10*3/uL (ref 0.0–0.2)
Basos: 1 %
EOS (ABSOLUTE): 0.2 10*3/uL (ref 0.0–0.4)
Eos: 3 %
Hematocrit: 38.6 % (ref 34.0–46.6)
Hemoglobin: 12.6 g/dL (ref 11.1–15.9)
Immature Grans (Abs): 0 10*3/uL (ref 0.0–0.1)
Immature Granulocytes: 0 %
Lymphocytes Absolute: 2.4 10*3/uL (ref 0.7–3.1)
Lymphs: 43 %
MCH: 31 pg (ref 26.6–33.0)
MCHC: 32.6 g/dL (ref 31.5–35.7)
MCV: 95 fL (ref 79–97)
Monocytes Absolute: 0.5 10*3/uL (ref 0.1–0.9)
Monocytes: 10 %
Neutrophils Absolute: 2.3 10*3/uL (ref 1.4–7.0)
Neutrophils: 43 %
Platelets: 349 10*3/uL (ref 150–450)
RBC: 4.07 x10E6/uL (ref 3.77–5.28)
RDW: 11.8 % (ref 11.7–15.4)
WBC: 5.5 10*3/uL (ref 3.4–10.8)

## 2023-06-12 LAB — COMPREHENSIVE METABOLIC PANEL
ALT: 14 [IU]/L (ref 0–32)
AST: 17 [IU]/L (ref 0–40)
Albumin: 4.2 g/dL (ref 3.9–4.9)
Alkaline Phosphatase: 118 [IU]/L (ref 44–121)
BUN/Creatinine Ratio: 26 (ref 12–28)
BUN: 18 mg/dL (ref 8–27)
Bilirubin Total: 0.6 mg/dL (ref 0.0–1.2)
CO2: 23 mmol/L (ref 20–29)
Calcium: 9.4 mg/dL (ref 8.7–10.3)
Chloride: 104 mmol/L (ref 96–106)
Creatinine, Ser: 0.7 mg/dL (ref 0.57–1.00)
Globulin, Total: 3.2 g/dL (ref 1.5–4.5)
Glucose: 90 mg/dL (ref 70–99)
Potassium: 4 mmol/L (ref 3.5–5.2)
Sodium: 143 mmol/L (ref 134–144)
Total Protein: 7.4 g/dL (ref 6.0–8.5)
eGFR: 98 mL/min/{1.73_m2} (ref >59)

## 2023-06-12 LAB — LIPID PANEL
Chol/HDL Ratio: 2.1 {ratio} (ref 0.0–4.4)
Cholesterol, Total: 171 mg/dL (ref 100–199)
HDL: 80 mg/dL (ref >39)
LDL Chol Calc (NIH): 79 mg/dL (ref 0–99)
Triglycerides: 63 mg/dL (ref 0–149)
VLDL Cholesterol Cal: 12 mg/dL (ref 5–40)

## 2023-06-12 LAB — HEMOGLOBIN A1C
Est. average glucose Bld gHb Est-mCnc: 120 mg/dL
Hgb A1c MFr Bld: 5.8 % — ABNORMAL HIGH (ref 4.8–5.6)

## 2023-07-04 ENCOUNTER — Ambulatory Visit
Admission: RE | Admit: 2023-07-04 | Discharge: 2023-07-04 | Disposition: A | Discharge status: Home / Self Care | Payer: BC Managed Care – PPO | Source: Ambulatory Visit | Attending: Obstetrics and Gynecology | Admitting: Obstetrics and Gynecology

## 2023-07-04 DIAGNOSIS — Z1231 Encounter for screening mammogram for malignant neoplasm of breast: Secondary | ICD-10-CM | POA: Diagnosis not present

## 2024-05-31 NOTE — Progress Notes (Unsigned)
 No chief complaint on file.  NEEDS PCP  HPI:      Renee Bruce is a 63 y.o. G1P1001 who LMP was No LMP recorded. Patient is postmenopausal., presents today for her annual examination.  Her menses are absent due to menopause. She does not have PMB. She does have occas vasomotor sx.  Sex activity: not sexually active. She does not have vaginal dryness/sx.   Last Pap: 05/21/22 Results were: no abnormalities /neg HPV DNA.  Hx of STDs: none   Last mammogram: 07/04/23  Results were: normal--routine follow-up in 12 months There is a FH of breast cancer in her PGM, genetic testing not indicated. There is no FH of ovarian cancer. The patient does not do self-breast exams.   Colonoscopy: colonoscopy 04/2016 with [facility] with hyperplastic polyp; pt unsure when due for repeat  (I can't find it in [facility] notes) but ? 10 yrs per guidelines   Tobacco use: The patient denies current or previous tobacco use. Alcohol use: none No drug use Exercise: mod active   She does get adequate calcium and Vitamin D in her diet.   Normal labs 11/24 except pre-DM GERD sx improved this yr.  Pt does tanning bed. Hasn't seen derm in a few yrs for mole check.  Past Medical History:  Diagnosis Date   Fibroid    History of mammogram 03/11/2015; 03/11/16   birad I; neg   History of Papanicolaou smear of cervix 01/05/2014   -/-    Past Surgical History:  Procedure Laterality Date   COLONOSCOPY N/A 05/03/2016   Procedure: COLONOSCOPY;  Surgeon: Gladis RAYMOND Mariner, MD;  Location: [facility] ENDOSCOPY;  Service: Endoscopy;  Laterality: N/A;   COLONOSCOPY  04/2016   non-cancerous polyp; Dr. Viktoria; repeat in 5-56yrs    Family History  Problem Relation Age of Onset   Breast cancer Paternal Grandmother 59   Hypertension Brother    Cancer Maternal Grandfather 25    Social History   Socioeconomic History   Marital status: Divorced    Spouse name: Not on file   Number of children: Not on file   Years of  education: Not on file   Highest education level: Not on file  Occupational History   Not on file  Tobacco Use   Smoking status: Former   Smokeless tobacco: Never  Vaping Use   Vaping status: Never Used  Substance and Sexual Activity   Alcohol use: Not Currently   Drug use: No   Sexual activity: Not Currently    Birth control/protection: None, Post-menopausal  Other Topics Concern   Not on file  Social History Narrative   Not on file   Social Drivers of Health   Financial Resource Strain: Not on file  Food Insecurity: Not on file  Transportation Needs: Not on file  Physical Activity: Not on file  Stress: Not on file  Social Connections: Not on file  Intimate Partner Violence: Not on file    No current outpatient medications on file prior to visit.   No current facility-administered medications on file prior to visit.    ROS:  Review of Systems  Constitutional:  Negative for fatigue, fever and unexpected weight change.  Respiratory:  Negative for cough, shortness of breath and wheezing.   Cardiovascular:  Negative for chest pain, palpitations and leg swelling.  Gastrointestinal:  Negative for blood in stool, constipation, diarrhea, nausea and vomiting.  Endocrine: Negative for cold intolerance, heat intolerance and polyuria.  Genitourinary:  Negative for dyspareunia, dysuria, flank pain, frequency, genital sores, hematuria, menstrual problem, pelvic pain, urgency, vaginal bleeding, vaginal discharge and vaginal pain.  Musculoskeletal:  Negative for arthralgias, back pain, joint swelling and myalgias.  Skin:  Negative for rash.  Neurological:  Negative for dizziness, syncope, light-headedness, numbness and headaches.  Hematological:  Negative for adenopathy.  Psychiatric/Behavioral:  Negative for agitation, confusion, sleep disturbance and suicidal ideas. The patient is not nervous/anxious.      Objective: There were no vitals taken for this visit.   Physical  Exam Constitutional:      Appearance: She is well-developed.  Genitourinary:     Vulva normal.     Right Labia: No rash, tenderness or lesions.    Left Labia: No tenderness, lesions or rash.    No vaginal discharge, erythema or tenderness.      Right Adnexa: not tender and no mass present.    Left Adnexa: not tender and no mass present.    No cervical friability or polyp.     Uterus is not enlarged or tender.  Breasts:    Right: No mass, nipple discharge, skin change or tenderness.     Left: No mass, nipple discharge, skin change or tenderness.  Neck:     Thyroid: No thyromegaly.  Cardiovascular:     Rate and Rhythm: Normal rate and regular rhythm.     Heart sounds: Normal heart sounds. No murmur heard. Pulmonary:     Effort: Pulmonary effort is normal.     Breath sounds: Normal breath sounds.  Abdominal:     Palpations: Abdomen is soft.     Tenderness: There is no abdominal tenderness. There is no guarding or rebound.  Musculoskeletal:        General: Normal range of motion.     Cervical back: Normal range of motion.  Lymphadenopathy:     Cervical: No cervical adenopathy.  Neurological:     General: No focal deficit present.     Mental Status: She is alert and oriented to person, place, and time.     Cranial Nerves: No cranial nerve deficit.  Skin:    General: Skin is warm and dry.     Comments: FEW ACTINIC KERATOSES DIFFUSELY  Psychiatric:        Mood and Affect: Mood normal.        Behavior: Behavior normal.        Thought Content: Thought content normal.        Judgment: Judgment normal.  Vitals reviewed.     Assessment/Plan: Encounter for annual routine gynecological examination  Encounter for screening mammogram for malignant neoplasm of breast - Plan: MM 3D SCREENING MAMMOGRAM BILATERAL BREAST; pt to schedule mammo  Blood tests for routine general physical examination - Plan: Comprehensive metabolic panel, CBC with Differential/Platelet, Lipid panel,  Hemoglobin A1c; will RTO for fasting labs  Screening cholesterol level - Plan: Lipid panel  Screening for diabetes mellitus - Plan: Hemoglobin A1c  Pre-diabetes - Plan: Hemoglobin A1c  Recommend derm appt for mole check           GYN counsel mammography screening,  adequate intake of calcium and vitamin D, diet and exercise     F/U  No follow-ups on file.  Kolbie Clarkston B. Sheretta Grumbine, PA-C 05/31/2024 1:10 PM

## 2024-06-01 ENCOUNTER — Encounter: Payer: Self-pay | Admitting: Obstetrics and Gynecology

## 2024-06-01 ENCOUNTER — Ambulatory Visit: Admitting: Obstetrics and Gynecology

## 2024-06-01 VITALS — BP 119/75 | HR 71 | Ht 66.75 in | Wt 201.0 lb

## 2024-06-01 DIAGNOSIS — Z01419 Encounter for gynecological examination (general) (routine) without abnormal findings: Secondary | ICD-10-CM

## 2024-06-01 DIAGNOSIS — R7303 Prediabetes: Secondary | ICD-10-CM | POA: Diagnosis not present

## 2024-06-01 DIAGNOSIS — Z1231 Encounter for screening mammogram for malignant neoplasm of breast: Secondary | ICD-10-CM

## 2024-06-01 DIAGNOSIS — Z13220 Encounter for screening for lipoid disorders: Secondary | ICD-10-CM

## 2024-06-01 DIAGNOSIS — Z0000 Encounter for general adult medical examination without abnormal findings: Secondary | ICD-10-CM

## 2024-06-01 NOTE — Patient Instructions (Signed)
 I value your feedback and you entrusting [place] with your care. If you get a [facility] patient survey, I would appreciate you taking the time to let us know about your experience today. Thank you!  [facility] ([place])--[phone]

## 2024-06-02 ENCOUNTER — Ambulatory Visit: Payer: Self-pay | Admitting: Obstetrics and Gynecology

## 2024-06-02 LAB — COMPREHENSIVE METABOLIC PANEL WITH GFR
ALT: 18 IU/L (ref 0–32)
AST: 21 IU/L (ref 0–40)
Albumin: 4.4 g/dL (ref 3.9–4.9)
Alkaline Phosphatase: 115 IU/L (ref 49–135)
BUN/Creatinine Ratio: 27 (ref 12–28)
BUN: 19 mg/dL (ref 8–27)
Bilirubin Total: 0.6 mg/dL (ref 0.0–1.2)
CO2: 25 mmol/L (ref 20–29)
Calcium: 9.9 mg/dL (ref 8.7–10.3)
Chloride: 102 mmol/L (ref 96–106)
Creatinine, Ser: 0.7 mg/dL (ref 0.57–1.00)
Globulin, Total: 3.2 g/dL (ref 1.5–4.5)
Glucose: 97 mg/dL (ref 70–99)
Potassium: 4.1 mmol/L (ref 3.5–5.2)
Sodium: 140 mmol/L (ref 134–144)
Total Protein: 7.6 g/dL (ref 6.0–8.5)
eGFR: 98 mL/min/1.73 (ref >59)

## 2024-06-02 LAB — CBC WITH DIFFERENTIAL/PLATELET
Basophils Absolute: 0.1 x10E3/uL (ref 0.0–0.2)
Basos: 1 %
EOS (ABSOLUTE): 0.2 x10E3/uL (ref 0.0–0.4)
Eos: 3 %
Hematocrit: 38.8 % (ref 34.0–46.6)
Hemoglobin: 12.8 g/dL (ref 11.1–15.9)
Immature Grans (Abs): 0 x10E3/uL (ref 0.0–0.1)
Immature Granulocytes: 0 %
Lymphocytes Absolute: 2.3 x10E3/uL (ref 0.7–3.1)
Lymphs: 43 %
MCH: 31.1 pg (ref 26.6–33.0)
MCHC: 33 g/dL (ref 31.5–35.7)
MCV: 94 fL (ref 79–97)
Monocytes Absolute: 0.5 x10E3/uL (ref 0.1–0.9)
Monocytes: 9 %
Neutrophils Absolute: 2.3 x10E3/uL (ref 1.4–7.0)
Neutrophils: 44 %
Platelets: 354 x10E3/uL (ref 150–450)
RBC: 4.11 x10E6/uL (ref 3.77–5.28)
RDW: 12.4 % (ref 11.7–15.4)
WBC: 5.3 x10E3/uL (ref 3.4–10.8)

## 2024-06-02 LAB — LIPID PANEL
Chol/HDL Ratio: 2.1 ratio (ref 0.0–4.4)
Cholesterol, Total: 173 mg/dL (ref 100–199)
HDL: 82 mg/dL (ref >39)
LDL Chol Calc (NIH): 81 mg/dL (ref 0–99)
Triglycerides: 49 mg/dL (ref 0–149)
VLDL Cholesterol Cal: 10 mg/dL (ref 5–40)

## 2024-06-02 LAB — HEMOGLOBIN A1C
Est. average glucose Bld gHb Est-mCnc: 120 mg/dL
Hgb A1c MFr Bld: 5.8 % — ABNORMAL HIGH (ref 4.8–5.6)

## 2024-07-22 ENCOUNTER — Ambulatory Visit
Admission: RE | Admit: 2024-07-22 | Discharge: 2024-07-22 | Disposition: A | Discharge status: Home / Self Care | Source: Ambulatory Visit | Attending: Obstetrics and Gynecology | Admitting: Obstetrics and Gynecology

## 2024-07-22 DIAGNOSIS — Z1231 Encounter for screening mammogram for malignant neoplasm of breast: Secondary | ICD-10-CM | POA: Diagnosis present
